# Patient Record
Sex: Female | Born: 1960 | Race: White | Hispanic: No | Marital: Married | Smoking: Never smoker
Health system: Southern US, Community
[De-identification: ages and names within clinical notes are randomized; demographics above are authoritative.]

## PROBLEM LIST (undated history)

## (undated) DIAGNOSIS — Z800 Family history of malignant neoplasm of digestive organs: Secondary | ICD-10-CM

## (undated) DIAGNOSIS — D649 Anemia, unspecified: Secondary | ICD-10-CM

## (undated) DIAGNOSIS — Z803 Family history of malignant neoplasm of breast: Secondary | ICD-10-CM

## (undated) DIAGNOSIS — Z8041 Family history of malignant neoplasm of ovary: Secondary | ICD-10-CM

## (undated) DIAGNOSIS — Z8249 Family history of ischemic heart disease and other diseases of the circulatory system: Secondary | ICD-10-CM

## (undated) HISTORY — PX: BREAST BIOPSY: SHX20

## (undated) HISTORY — DX: Family history of malignant neoplasm of digestive organs: Z80.0

## (undated) HISTORY — PX: TONSILLECTOMY AND ADENOIDECTOMY: SUR1326

## (undated) HISTORY — DX: Anemia, unspecified: D64.9

## (undated) HISTORY — DX: Family history of malignant neoplasm of ovary: Z80.41

## (undated) HISTORY — DX: Family history of ischemic heart disease and other diseases of the circulatory system: Z82.49

## (undated) HISTORY — PX: TUBAL LIGATION: SHX77

## (undated) HISTORY — PX: ENDOMETRIAL ABLATION: SHX621

## (undated) HISTORY — DX: Family history of malignant neoplasm of breast: Z80.3

---

## 2005-09-23 ENCOUNTER — Other Ambulatory Visit: Admission: RE | Admit: 2005-09-23 | Discharge: 2005-09-23 | Payer: Self-pay | Admitting: Obstetrics & Gynecology

## 2005-10-07 ENCOUNTER — Encounter: Admission: RE | Admit: 2005-10-07 | Discharge: 2005-10-07 | Payer: Self-pay | Admitting: Obstetrics & Gynecology

## 2006-11-02 ENCOUNTER — Other Ambulatory Visit: Admission: RE | Admit: 2006-11-02 | Discharge: 2006-11-02 | Payer: Self-pay | Admitting: Obstetrics & Gynecology

## 2006-11-03 ENCOUNTER — Encounter: Admission: RE | Admit: 2006-11-03 | Discharge: 2006-11-03 | Payer: Self-pay | Admitting: Obstetrics & Gynecology

## 2007-12-13 ENCOUNTER — Other Ambulatory Visit: Admission: RE | Admit: 2007-12-13 | Discharge: 2007-12-13 | Payer: Self-pay | Admitting: Obstetrics & Gynecology

## 2007-12-25 ENCOUNTER — Ambulatory Visit (HOSPITAL_BASED_OUTPATIENT_CLINIC_OR_DEPARTMENT_OTHER): Admission: RE | Admit: 2007-12-25 | Discharge: 2007-12-25 | Payer: Self-pay | Admitting: Obstetrics & Gynecology

## 2008-12-26 ENCOUNTER — Ambulatory Visit: Payer: Self-pay | Admitting: Diagnostic Radiology

## 2008-12-26 ENCOUNTER — Ambulatory Visit (HOSPITAL_BASED_OUTPATIENT_CLINIC_OR_DEPARTMENT_OTHER): Admission: RE | Admit: 2008-12-26 | Discharge: 2008-12-26 | Payer: Self-pay | Admitting: Obstetrics & Gynecology

## 2010-01-14 ENCOUNTER — Ambulatory Visit (HOSPITAL_BASED_OUTPATIENT_CLINIC_OR_DEPARTMENT_OTHER): Admission: RE | Admit: 2010-01-14 | Discharge: 2010-01-14 | Payer: Self-pay | Admitting: Obstetrics & Gynecology

## 2010-01-14 ENCOUNTER — Ambulatory Visit: Payer: Self-pay | Admitting: Diagnostic Radiology

## 2010-07-26 ENCOUNTER — Encounter: Payer: Self-pay | Admitting: Obstetrics & Gynecology

## 2011-01-28 ENCOUNTER — Other Ambulatory Visit: Payer: Self-pay | Admitting: Obstetrics & Gynecology

## 2011-01-28 DIAGNOSIS — Z1231 Encounter for screening mammogram for malignant neoplasm of breast: Secondary | ICD-10-CM

## 2011-02-10 ENCOUNTER — Ambulatory Visit (HOSPITAL_BASED_OUTPATIENT_CLINIC_OR_DEPARTMENT_OTHER)
Admission: RE | Admit: 2011-02-10 | Discharge: 2011-02-10 | Disposition: A | Discharge status: Home / Self Care | Payer: 59 | Source: Ambulatory Visit | Attending: Obstetrics & Gynecology | Admitting: Obstetrics & Gynecology

## 2011-02-10 DIAGNOSIS — Z1231 Encounter for screening mammogram for malignant neoplasm of breast: Secondary | ICD-10-CM | POA: Insufficient documentation

## 2012-02-24 ENCOUNTER — Other Ambulatory Visit: Payer: Self-pay | Admitting: Obstetrics & Gynecology

## 2012-02-24 DIAGNOSIS — Z1231 Encounter for screening mammogram for malignant neoplasm of breast: Secondary | ICD-10-CM

## 2012-02-25 ENCOUNTER — Ambulatory Visit (HOSPITAL_BASED_OUTPATIENT_CLINIC_OR_DEPARTMENT_OTHER)
Admission: RE | Admit: 2012-02-25 | Discharge: 2012-02-25 | Disposition: A | Discharge status: Home / Self Care | Payer: 59 | Source: Ambulatory Visit | Attending: Obstetrics & Gynecology | Admitting: Obstetrics & Gynecology

## 2012-02-25 DIAGNOSIS — Z1231 Encounter for screening mammogram for malignant neoplasm of breast: Secondary | ICD-10-CM | POA: Insufficient documentation

## 2013-02-16 ENCOUNTER — Other Ambulatory Visit: Payer: Self-pay | Admitting: Obstetrics & Gynecology

## 2013-02-16 DIAGNOSIS — Z1231 Encounter for screening mammogram for malignant neoplasm of breast: Secondary | ICD-10-CM

## 2013-02-26 ENCOUNTER — Inpatient Hospital Stay (HOSPITAL_BASED_OUTPATIENT_CLINIC_OR_DEPARTMENT_OTHER): Admission: RE | Admit: 2013-02-26 | Payer: 59 | Source: Ambulatory Visit

## 2013-03-07 ENCOUNTER — Ambulatory Visit (HOSPITAL_BASED_OUTPATIENT_CLINIC_OR_DEPARTMENT_OTHER)
Admission: RE | Admit: 2013-03-07 | Discharge: 2013-03-07 | Disposition: A | Discharge status: Home / Self Care | Payer: 59 | Source: Ambulatory Visit | Attending: Obstetrics & Gynecology | Admitting: Obstetrics & Gynecology

## 2013-03-07 DIAGNOSIS — Z1231 Encounter for screening mammogram for malignant neoplasm of breast: Secondary | ICD-10-CM

## 2013-03-30 ENCOUNTER — Encounter: Payer: Self-pay | Admitting: Obstetrics & Gynecology

## 2013-08-23 ENCOUNTER — Encounter: Payer: Self-pay | Admitting: Obstetrics & Gynecology

## 2013-08-24 ENCOUNTER — Ambulatory Visit (INDEPENDENT_AMBULATORY_CARE_PROVIDER_SITE_OTHER): Payer: 59 | Admitting: Obstetrics & Gynecology

## 2013-08-24 ENCOUNTER — Encounter: Payer: Self-pay | Admitting: Obstetrics & Gynecology

## 2013-08-24 VITALS — BP 122/78 | HR 64 | Resp 16 | Ht 61.25 in | Wt 196.8 lb

## 2013-08-24 DIAGNOSIS — Z01419 Encounter for gynecological examination (general) (routine) without abnormal findings: Secondary | ICD-10-CM

## 2013-08-24 DIAGNOSIS — Z0000 Encounter for general adult medical examination without abnormal findings: Secondary | ICD-10-CM

## 2013-08-24 LAB — POCT URINALYSIS DIPSTICK
Bilirubin, UA: NEGATIVE
Glucose, UA: NEGATIVE
KETONES UA: NEGATIVE
NITRITE UA: NEGATIVE
PH UA: 6
PROTEIN UA: NEGATIVE
UROBILINOGEN UA: NEGATIVE

## 2013-08-24 LAB — HEMOGLOBIN, FINGERSTICK: HEMOGLOBIN, FINGERSTICK: 12.7 g/dL (ref 12.0–16.0)

## 2013-08-24 NOTE — Patient Instructions (Signed)

## 2013-08-24 NOTE — Progress Notes (Signed)
53 y.o. G3P3 MarriedCaucasianF here for annual exam.  Cycles are becoming less frequently.  Hasn't skipped 90 days yet.  When she does have a cycle, it is heavy and lasts 6-7 for 2-3 of these heavy.  A little heavier than her "normal" cycles.  She knows if she doesn't have a cycle for 90 days, she needs to call for Rx for provera.    Son is getting married in the summer.  Pt working on weight loss.  Patient's last menstrual period was 06/29/2013.          Sexually active: yes  The current method of family planning is tubal ligation.    Exercising: yes  walking dog Smoker:  no  Health Maintenance: Pap:  05/18/12 WNL/negative HR HPV History of abnormal Pap:  no MMG:  03/07/13 normal Colonoscopy:  none BMD:   none TDaP:  7/10 Screening Labs: 11/13, Hb today: 12.7, Urine today: WBC-trace, RBC-trace, PH-6.0   reports that she has never smoked. She has never used smokeless tobacco. She reports that she does not drink alcohol or use illicit drugs.  Past Medical History  Diagnosis Date  . Anemia   . Family history of breast cancer     took tamoxifen prophylactically    Past Surgical History  Procedure Laterality Date  . Endometrial ablation      hysteroscopy  . Tubal ligation    . Tonsillectomy and adenoidectomy      Current Outpatient Prescriptions  Medication Sig Dispense Refill  . Calcium-Vitamin D-Vitamin K (CALCIUM SOFT CHEWS PO) Take by mouth daily.      . IRON PO Take 65 mg by mouth daily.        No current facility-administered medications for this visit.    Family History  Problem Relation Age of Onset  . Breast cancer Sister [id number]  . Heart attack Father   . Heart attack Mother   . Heart attack Brother   . Dementia Mother     ROS:  Pertinent items are noted in HPI.  Otherwise, a comprehensive ROS was negative.  Exam:   BP 122/78  Pulse 64  Resp 16  Ht 5' 1.25" (1.556 m)  Wt 196 lb 12.8 oz (89.268 kg)  BMI 36.87 kg/m2  LMP 06/29/2013  Weight change:-15lb    Height: 5' 1.25" (155.6 cm)  Ht Readings from Last 3 Encounters:  08/24/13 5' 1.25" (1.556 m)    General appearance: alert, cooperative and appears stated age Head: Normocephalic, without obvious abnormality, atraumatic Neck: no adenopathy, supple, symmetrical, trachea midline and thyroid normal to inspection and palpation Lungs: clear to auscultation bilaterally Breasts: normal appearance, no masses or tenderness Heart: regular rate and rhythm Abdomen: soft, non-tender; bowel sounds normal; no masses,  no organomegaly Extremities: extremities normal, atraumatic, no cyanosis or edema Skin: Skin color, texture, turgor normal. No rashes or lesions Lymph nodes: Cervical, supraclavicular, and axillary nodes normal. No abnormal inguinal nodes palpated Neurologic: Grossly normal   Pelvic: External genitalia:  no lesions              Urethra:  normal appearing urethra with no masses, tenderness or lesions              Bartholins and Skenes: normal                 Vagina: normal appearing vagina with normal color and discharge, no lesions              Cervix: no lesions  Pap taken: no Bimanual Exam:  Uterus:  normal size, contour, position, consistency, mobility, non-tender              Adnexa: normal adnexa and no mass, fullness, tenderness               Rectovaginal: Confirms               Anus:  normal sphincter tone, no lesions  A:  Well Woman with normal exam Perimenopausal H/O menorrhagia with u/s and endometrial biopsy 9/11 Strong family hx of breast cancer.  Took Tamoxifen prophylacticly. Family hx of cardiovascular disease  P:   Mammogram yearly.  D/W pt 3D screening due to family hx. pap smear with neg HR HPV testing 11/13. Labs last year.  Will schedule colonoscopy with PCP. D/w pt referral to cardiology for establishing care.  Baby aspirin discussed. return annually or prn  An After Visit Summary was printed and given to the patient.

## 2014-03-08 ENCOUNTER — Other Ambulatory Visit: Payer: Self-pay | Admitting: Obstetrics & Gynecology

## 2014-03-08 DIAGNOSIS — Z1231 Encounter for screening mammogram for malignant neoplasm of breast: Secondary | ICD-10-CM

## 2014-03-18 ENCOUNTER — Ambulatory Visit (HOSPITAL_BASED_OUTPATIENT_CLINIC_OR_DEPARTMENT_OTHER)
Admission: RE | Admit: 2014-03-18 | Discharge: 2014-03-18 | Disposition: A | Discharge status: Home / Self Care | Payer: 59 | Source: Ambulatory Visit | Attending: Obstetrics & Gynecology | Admitting: Obstetrics & Gynecology

## 2014-03-18 DIAGNOSIS — Z1231 Encounter for screening mammogram for malignant neoplasm of breast: Secondary | ICD-10-CM | POA: Diagnosis not present

## 2014-05-06 ENCOUNTER — Encounter: Payer: Self-pay | Admitting: Obstetrics & Gynecology

## 2014-09-05 ENCOUNTER — Ambulatory Visit (INDEPENDENT_AMBULATORY_CARE_PROVIDER_SITE_OTHER): Payer: 59 | Admitting: Obstetrics & Gynecology

## 2014-09-05 ENCOUNTER — Encounter: Payer: Self-pay | Admitting: Obstetrics & Gynecology

## 2014-09-05 VITALS — BP 124/84 | HR 60 | Resp 16 | Ht 61.5 in | Wt 214.2 lb

## 2014-09-05 DIAGNOSIS — R312 Other microscopic hematuria: Secondary | ICD-10-CM

## 2014-09-05 DIAGNOSIS — Z8249 Family history of ischemic heart disease and other diseases of the circulatory system: Secondary | ICD-10-CM | POA: Diagnosis not present

## 2014-09-05 DIAGNOSIS — Z01419 Encounter for gynecological examination (general) (routine) without abnormal findings: Secondary | ICD-10-CM

## 2014-09-05 DIAGNOSIS — Z124 Encounter for screening for malignant neoplasm of cervix: Secondary | ICD-10-CM

## 2014-09-05 DIAGNOSIS — R3129 Other microscopic hematuria: Secondary | ICD-10-CM

## 2014-09-05 DIAGNOSIS — Z0000 Encounter for general adult medical examination without abnormal findings: Secondary | ICD-10-CM

## 2014-09-05 LAB — POCT URINALYSIS DIPSTICK
Bilirubin, UA: NEGATIVE
Glucose, UA: NEGATIVE
Ketones, UA: NEGATIVE
LEUKOCYTES UA: NEGATIVE
NITRITE UA: NEGATIVE
PH UA: 5
PROTEIN UA: NEGATIVE
UROBILINOGEN UA: NEGATIVE

## 2014-09-05 LAB — URINALYSIS, MICROSCOPIC ONLY
Bacteria, UA: NONE SEEN
CASTS: NONE SEEN
Crystals: NONE SEEN
SQUAMOUS EPITHELIAL / LPF: NONE SEEN

## 2014-09-05 NOTE — Progress Notes (Signed)
54 y.o. G3P3 MarriedCaucasianF here for annual exam.  Reports cycles are becoming less frequent.  First six moths of the year were every three months but in September they became more regular.  Cycle in January was very heavy for three days.  This was much heavier than and lasted longer then normal for her.  Didn't have a February cycle and hasn't cycled this month already either.  Pt has a strong family hx of cardiovascular disease.  Two of three brothers have had MI's.  Mother with hx of MI.  We discussed last year establishing care with cardiologist now.  She spoke with PCP about this and he felt that was unnecessary.  Denies CP and SOB.  PCP:  Dr. Leonette MostKalish.  Did blood work last summer with him.    Patient's last menstrual period was 07/06/2014.          Sexually active: Yes.    The current method of family planning is tubal ligation.    Exercising: Yes.    walking daily-30 minutes Smoker:  no  Health Maintenance: Pap:  05/18/12 WNL/negative HR HPV History of abnormal Pap:  no MMG:  03/18/14-normal Colonoscopy:  7/15-repeat in 3 years, five polyps (Dr. Conley RollsLe) BMD:   none TDaP:  7/10 Screening Labs: pcp, Hb today: donate blood, Urine today: RBC-1+   reports that she has never smoked. She has never used smokeless tobacco. She reports that she does not drink alcohol or use illicit drugs.  Past Medical History  Diagnosis Date  . Anemia   . Family history of breast cancer     took tamoxifen prophylactically    Past Surgical History  Procedure Laterality Date  . Endometrial ablation      hysteroscopy  . Tubal ligation    . Tonsillectomy and adenoidectomy      Current Outpatient Prescriptions  Medication Sig Dispense Refill  . Calcium-Vitamin D-Vitamin K (CALCIUM SOFT CHEWS PO) Take by mouth daily.    . IRON PO Take 65 mg by mouth daily.     . Multiple Vitamins-Calcium (ONE-A-DAY WOMENS PO) Take by mouth.    Marland Kitchen. PATADAY 0.2 % SOLN   11   No current facility-administered medications  for this visit.    Family History  Problem Relation Age of Onset  . Breast cancer Sister 5639  . Heart attack Father   . Heart attack Mother   . Heart attack Brother   . Dementia Mother     ROS:  Pertinent items are noted in HPI.  Otherwise, a comprehensive ROS was negative.  Exam:   General appearance: alert, cooperative and appears stated age Head: Normocephalic, without obvious abnormality, atraumatic Neck: no adenopathy, supple, symmetrical, trachea midline and thyroid normal to inspection and palpation Lungs: clear to auscultation bilaterally Breasts: normal appearance, no masses or tenderness Heart: regular rate and rhythm Abdomen: soft, non-tender; bowel sounds normal; no masses,  no organomegaly Extremities: extremities normal, atraumatic, no cyanosis or edema Skin: Skin color, texture, turgor normal. No rashes or lesions Lymph nodes: Cervical, supraclavicular, and axillary nodes normal. No abnormal inguinal nodes palpated Neurologic: Grossly normal   Pelvic: External genitalia:  no lesions              Urethra:  normal appearing urethra with no masses, tenderness or lesions              Bartholins and Skenes: normal                 Vagina: normal appearing  vagina with normal color and discharge, no lesions              Cervix: no lesions              Pap taken: Yes.   Bimanual Exam:  Uterus:  normal size, contour, position, consistency, mobility, non-tender              Adnexa: normal adnexa and no mass, fullness, tenderness               Rectovaginal: Confirms               Anus:  normal sphincter tone, no lesions  Chaperone was present for exam.  A:  Well Woman with normal exam Perimenopausal H/O menorrhagia with u/s and endometrial biopsy 9/11 Strong family hx of breast cancer. Took Tamoxifen prophylacticly. Family hx of cardiovascular disease Microscopic hematuria  P: Mammogram yearly. D/W pt 3D screening due to family hx. pap smear with neg HR  HPV testing 11/13.  Pap done today. Labs with PCP. Cardiology referral will be made.   Urine today. Release of colonoscopy report return annually or prn

## 2014-09-06 LAB — IPS PAP TEST WITH REFLEX TO HPV

## 2014-10-10 NOTE — Progress Notes (Addendum)
Patient ID: Angel LeveringSandra Burch, female   DOB: Aug 17, 1960, 54 y.o.   MRN: [id number]     Cardiology Office Note   Date:  10/10/2014   ID:  Angel LeveringSandra Burch, DOB Aug 17, 1960, MRN [id number]  PCP:  Sid FalconKALISH, MICHAEL J, MD  Cardiologist:   Charlton HawsPeter Jaeden Messer, MD   No chief complaint on file.     History of Present Illness: Angel Burch is a 54 y.o. female who presents for evaluation of CAD risk  Pt has a strong family hx of cardiovascular disease. Two of three brothers have had MI's. Mother with hx of MI. Reviewed her family tree graphic which is quite impressive especially on fathers side and two brothers. She has no symptoms or chest pain  ECG ok  Cholesterol only 101 not at level that would need Rx by guidelines.  Non smoker  Discussed screening tests and felt calcium score best test for her   Labs from primary reviewed normal BMET, CBC and TSH  TC 182  LDL 101    She had coronary calcium score today which I reviewed  Score 169 97th percentile for age and sex matched controls Also noted liver steatosis  She drinks ETOH rarely  Discussed initiation of statin and asa with f/u ETT given high relative score for age   Past Medical History  Diagnosis Date  . Anemia   . Family history of breast cancer     took tamoxifen prophylactically  . Family history of heart disease     Past Surgical History  Procedure Laterality Date  . Endometrial ablation      hysteroscopy  . Tubal ligation    . Tonsillectomy and adenoidectomy       Current Outpatient Prescriptions  Medication Sig Dispense Refill  . Calcium-Vitamin D-Vitamin K (CALCIUM SOFT CHEWS PO) Take by mouth daily.    . IRON PO Take 65 mg by mouth daily.     . Multiple Vitamins-Calcium (ONE-A-DAY WOMENS PO) Take by mouth.    Marland Kitchen. PATADAY 0.2 % SOLN   11   No current facility-administered medications for this visit.    Allergies:   Tape    Social History:  The patient  reports that she has never smoked. She has never used smokeless tobacco. She  reports that she does not drink alcohol or use illicit drugs.   Family History:  The patient's family history includes Breast cancer (age of onset: 8839) in her sister; Cirrhosis in her paternal grandfather; Dementia in her mother; Emphysema in her maternal grandfather; Heart attack in her brother, brother, father, and mother; Heart disease in her paternal grandmother; Pulmonary embolism in her paternal aunt.    ROS:  Please see the history of present illness.   Otherwise, review of systems are positive for none.   All other systems are reviewed and negative.    PHYSICAL EXAM: VS:  There were no vitals taken for this visit. , BMI There is no weight on file to calculate BMI. GEN: Well nourished, well developed, in no acute distress HEENT: normal Neck: no JVD, carotid bruits, or masses Cardiac:  RRR; no murmurs, rubs, or gallops,no edema  Respiratory:  clear to auscultation bilaterally, normal work of breathing GI: soft, nontender, nondistended, + BS MS: no deformity or atrophy Skin: warm and dry, no rash Neuro:  Strength and sensation are intact Psych: euthymic mood, full affect   EKG:   SR rate 58  Low voltage     Recent Labs: No results found for requested labs  within last 365 days.    Lipid Panel No results found for: CHOL, TRIG, HDL, CHOLHDL, VLDL, LDLCALC, LDLDIRECT    Wt Readings from Last 3 Encounters:  09/05/14 214 lb 3.2 oz (97.16 kg)  08/24/13 196 lb 12.8 oz (89.268 kg)      Other studies Reviewed: Additional studies/ records that were reviewed today include: notes from OB/GYN.    ASSESSMENT AND PLAN:  1.  CAD :  Bad family history with high CACS score  Start statin and asa f/u ETT 2. Liver steatosis: not a drinker she is middle aged and overweight f/u with primary discussed with her    Current medicines are reviewed at length with the patient today.  The patient does not have concerns regarding medicines.  The following changes have been made:  no  change  Labs/ tests ordered today include:  Calcium Score  No orders of the defined types were placed in this encounter.     Disposition:   FU with  In a year     i    Signed, Charlton Haws, MD  10/10/2014 9:23 AM    [facility] [address] [place], [place], [place]  [address] Phone: [phone]; Fax: [phone]

## 2014-10-11 ENCOUNTER — Ambulatory Visit: Payer: Self-pay | Admitting: Cardiovascular Disease

## 2014-10-11 ENCOUNTER — Encounter: Payer: Self-pay | Admitting: Cardiovascular Disease

## 2014-10-11 ENCOUNTER — Ambulatory Visit (INDEPENDENT_AMBULATORY_CARE_PROVIDER_SITE_OTHER)
Admission: RE | Admit: 2014-10-11 | Discharge: 2014-10-11 | Disposition: A | Discharge status: Home / Self Care | Payer: 59 | Source: Ambulatory Visit | Attending: Cardiovascular Disease | Admitting: Cardiovascular Disease

## 2014-10-11 ENCOUNTER — Ambulatory Visit (INDEPENDENT_AMBULATORY_CARE_PROVIDER_SITE_OTHER): Payer: 59 | Admitting: Cardiovascular Disease

## 2014-10-11 ENCOUNTER — Telehealth: Payer: Self-pay | Admitting: *Deleted

## 2014-10-11 VITALS — BP 120/78 | HR 58 | Ht 62.0 in | Wt 213.8 lb

## 2014-10-11 DIAGNOSIS — Z8249 Family history of ischemic heart disease and other diseases of the circulatory system: Secondary | ICD-10-CM | POA: Diagnosis not present

## 2014-10-11 DIAGNOSIS — R931 Abnormal findings on diagnostic imaging of heart and coronary circulation: Secondary | ICD-10-CM

## 2014-10-11 MED ORDER — ATORVASTATIN CALCIUM 10 MG PO TABS: 10.0000 mg | ORAL_TABLET | Freq: Every day | ORAL | Status: DC

## 2014-10-11 NOTE — Telephone Encounter (Signed)
PT  AWARE  NEEDS  GXT   AND  TO START  ATORVASTATIN   10 MG   NEEDS LIPID AND LIVER IN 3 MONTHS  AS  WELL AS  F/U APPT  WITH DR Eden EmmsNISHAN .ORDERS  ENTERED  .Zack Seal/CY

## 2014-10-11 NOTE — Addendum Note (Signed)
Addended by: Wendall StadeNISHAN, Derreck Wiltsey C on: 10/11/2014 12:16 PM   Modules accepted: Level of Service

## 2014-10-11 NOTE — Patient Instructions (Addendum)
Your physician recommends that you schedule a follow-up appointment in: [facility]  Your physician recommends that you continue on your current medications as directed. Please refer to the Current Medication list given to you today.  CALCIUM SCORE OUT OF POCKET  $150.00

## 2014-11-18 ENCOUNTER — Encounter: Payer: 59 | Admitting: Physician Assistant

## 2014-11-18 ENCOUNTER — Ambulatory Visit (INDEPENDENT_AMBULATORY_CARE_PROVIDER_SITE_OTHER): Payer: 59

## 2014-11-18 ENCOUNTER — Telehealth: Payer: Self-pay

## 2014-11-18 DIAGNOSIS — I2510 Atherosclerotic heart disease of native coronary artery without angina pectoris: Secondary | ICD-10-CM | POA: Diagnosis not present

## 2014-11-18 DIAGNOSIS — R931 Abnormal findings on diagnostic imaging of heart and coronary circulation: Secondary | ICD-10-CM

## 2014-11-18 LAB — EXERCISE TOLERANCE TEST
CHL CUP MPHR: 166 {beats}/min
CHL CUP STRESS STAGE 1 GRADE: 0 %
CHL CUP STRESS STAGE 1 HR: 116 {beats}/min
CHL CUP STRESS STAGE 1 SPEED: 0 mph
CHL CUP STRESS STAGE 2 GRADE: 0 %
CHL CUP STRESS STAGE 2 HR: 104 {beats}/min
CHL CUP STRESS STAGE 3 HR: 104 {beats}/min
CHL CUP STRESS STAGE 4 DBP: 81 mmHg
CHL CUP STRESS STAGE 4 SPEED: 1.7 mph
CHL CUP STRESS STAGE 5 SPEED: 2.5 mph
CHL CUP STRESS STAGE 6 GRADE: 14 %
CHL CUP STRESS STAGE 6 HR: 148 {beats}/min
CHL CUP STRESS STAGE 7 DBP: 88 mmHg
CHL CUP STRESS STAGE 7 GRADE: 0 %
CHL CUP STRESS STAGE 8 GRADE: 0 %
CSEPEW: 8.5 METS
CSEPHR: 89 %
CSEPPHR: 148 {beats}/min
CSEPPMHR: 89 %
Exercise duration (min): 7 min
RPE: 17
Rest HR: 74 {beats}/min
Stage 1 DBP: 62 mmHg
Stage 1 SBP: 126 mmHg
Stage 2 Speed: 1 mph
Stage 3 Grade: 0.1 %
Stage 3 Speed: 1 mph
Stage 4 Grade: 10 %
Stage 4 HR: 121 {beats}/min
Stage 4 SBP: 174 mmHg
Stage 5 DBP: 83 mmHg
Stage 5 Grade: 12 %
Stage 5 HR: 136 {beats}/min
Stage 5 SBP: 193 mmHg
Stage 6 Speed: 3.4 mph
Stage 7 HR: 129 {beats}/min
Stage 7 SBP: 185 mmHg
Stage 7 Speed: 1.5 mph
Stage 8 DBP: 70 mmHg
Stage 8 HR: 100 {beats}/min
Stage 8 SBP: 120 mmHg
Stage 8 Speed: 0 mph

## 2014-11-18 NOTE — Telephone Encounter (Signed)
-----   Message from Jerene BearsMary S Miller, MD sent at 10/29/2014 10:58 PM EDT ----- Micro was negative.  Nothing else needed.  If you have any concerns, can we discuss when I return to office?  MSM ----- Message -----    From: Elisha HeadlandKelly S Eleftheria Taborn, CMA    Sent: 10/29/2014   9:24 AM      To: Jerene BearsMary S Miller, MD  Dr Hyacinth MeekerMiller, will you make sure nothing is needed for the urine micro this patient had done on 09/05/14//kn

## 2014-11-19 ENCOUNTER — Telehealth: Payer: Self-pay | Admitting: Cardiovascular Disease

## 2014-11-19 NOTE — Telephone Encounter (Signed)
Called patient back with stress test results. Patient verbalized understanding. Patient informed the office that she is suppose to see Dr. Eden EmmsNishan in 3 months. Transferred patient to scheduling to help her with making an appointment.

## 2014-11-19 NOTE — Telephone Encounter (Signed)
New message      Returning Christine's call from yesterday

## 2015-01-18 ENCOUNTER — Other Ambulatory Visit: Payer: Self-pay | Admitting: Cardiovascular Disease

## 2015-01-20 ENCOUNTER — Other Ambulatory Visit: Payer: Self-pay

## 2015-01-20 MED ORDER — ATORVASTATIN CALCIUM 10 MG PO TABS: 10.0000 mg | ORAL_TABLET | Freq: Every day | ORAL | Status: DC

## 2015-02-03 NOTE — Progress Notes (Signed)
Patient ID: Angel Burch, female   DOB: 1960/09/25, 54 y.o.   MRN: [id number]     Cardiology Office Note   Date:  02/07/2015   ID:  Angel Burch, DOB Jun 20, 1961, MRN [id number]  PCP:  Sid Falcon, MD  Cardiologist:   Charlton Haws, MD   Chief Complaint  Patient presents with  . Follow-up    family history of earlier CAD      History of Present Illness: Angel Burch is a 54 y.o. female who presents for evaluation of CAD risk  Pt has a strong family hx of cardiovascular disease. Two of three brothers have had MI's. Mother with hx of MI. Reviewed her family tree graphic which is quite impressive especially on fathers side and two brothers. She has no symptoms or chest pain  ECG ok  Cholesterol only 101 not at level that would need Rx by guidelines.  Non smoker  Discussed screening tests and felt calcium score best test for her   Labs from primary reviewed normal BMET, CBC and TSH  TC 182  LDL 101  Now on statin last LDL 02/2015 down to 55   She had coronary calcium score April  which I reviewed  Score 169 97th percentile for age and sex matched controls Also noted liver steatosis  She drinks ETOH rarely  Started on statin last visit with ASA  Some right knee pain that may be more arthritic than statin related  ETT done 11/18/14 and was normal    No cardiac complaints  Sees Dr Callie Fielding at [facility].  Recent poison ivy and some chronic left cervical neck pain   Most recent office notes and labs from primary reviewed   Past Medical History  Diagnosis Date  . Anemia   . Family history of breast cancer     took tamoxifen prophylactically  . Family history of heart disease     Past Surgical History  Procedure Laterality Date  . Endometrial ablation      hysteroscopy  . Tubal ligation    . Tonsillectomy and adenoidectomy       Current Outpatient Prescriptions  Medication Sig Dispense Refill  . aspirin 81 MG tablet Take 81 mg by mouth daily.    Marland Kitchen atorvastatin  (LIPITOR) 10 MG tablet Take 1 tablet (10 mg total) by mouth daily. 30 tablet 3  . Calcium-Vitamin D-Vitamin K (CALCIUM SOFT CHEWS PO) Take 1 tablet by mouth daily.     . IRON PO Take 45 mg by mouth daily.     . Multiple Vitamin (MULTIVITAMIN) tablet Take 1 tablet by mouth daily.    Marland Kitchen PATADAY 0.2 % SOLN Place 1 drop into both eyes daily.   11   No current facility-administered medications for this visit.    Allergies:   Tape    Social History:  The patient  reports that she has never smoked. She has never used smokeless tobacco. She reports that she does not drink alcohol or use illicit drugs.   Family History:  The patient's family history includes Breast cancer (age of onset: 103) in her sister; Cirrhosis in her paternal grandfather; Dementia in her mother; Emphysema in her maternal grandfather; Heart attack in her brother, brother, father, and mother; Heart disease in her paternal grandmother; Pulmonary embolism in her paternal aunt.    ROS:  Please see the history of present illness.   Otherwise, review of systems are positive for none.   All other systems are reviewed and negative.  PHYSICAL EXAM: VS:  BP 124/72 mmHg  Pulse 72  Ht  (1.575 m)  Wt 98.34 kg (216 lb 12.8 oz)  BMI 39.64 kg/m2  SpO2 98% , BMI Body mass index is 39.64 kg/(m^2). GEN: Well nourished, well developed, in no acute distress HEENT: normal Neck: no JVD, carotid bruits, or masses Cardiac:  RRR; no murmurs, rubs, or gallops,no edema  Respiratory:  clear to auscultation bilaterally, normal work of breathing GI: soft, nontender, nondistended, + BS MS: no deformity or atrophy Skin: warm and dry, no rash Neuro:  Strength and sensation are intact Psych: euthymic mood, full affect   EKG:   SR rate 58  Low voltage     Recent Labs: 02/04/2015: ALT 20    Lipid Panel    Component Value Date/Time   CHOL 136 02/04/2015 0906   TRIG 92.0 02/04/2015 0906   HDL 62.90 02/04/2015 0906   CHOLHDL 2 02/04/2015  0906   VLDL 18.4 02/04/2015 0906   LDLCALC 55 02/04/2015 0906      Wt Readings from Last 3 Encounters:  02/07/15 98.34 kg (216 lb 12.8 oz)  10/11/14 96.979 kg (213 lb 12.8 oz)  09/05/14 97.16 kg (214 lb 3.2 oz)      Other studies Reviewed: Additional studies/ records that were reviewed today include: notes from OB/GYN. / [facility]     ASSESSMENT AND PLAN:  1.  CAD :  Bad family history with high CACS score   Tolerating statin / ASA  LDL very low now FU Calcium score 3 years 2. Liver steatosis: not a drinker she is middle aged and overweight f/u with primary discussed with her  3. Poison ivy LE;s resolved  4. Right knee pain.  Normal exam doubt related to statin given asymmetry follow    Current medicines are reviewed at length with the patient today.  The patient does not have concerns regarding medicines.  The following changes have been made:  no change  Labs/ tests ordered today include:  Calcium Score  No orders of the defined types were placed in this encounter.     Disposition:   FU with  In a year     i    Signed, Charlton Haws, MD  02/07/2015 8:06 AM    [facility] [address] [place], [place], [place]  [address] Phone: [phone]; Fax: [phone]

## 2015-02-04 ENCOUNTER — Other Ambulatory Visit (INDEPENDENT_AMBULATORY_CARE_PROVIDER_SITE_OTHER): Payer: 59 | Admitting: *Deleted

## 2015-02-04 DIAGNOSIS — I2510 Atherosclerotic heart disease of native coronary artery without angina pectoris: Secondary | ICD-10-CM

## 2015-02-04 DIAGNOSIS — R931 Abnormal findings on diagnostic imaging of heart and coronary circulation: Secondary | ICD-10-CM

## 2015-02-04 LAB — LIPID PANEL
Cholesterol: 136 mg/dL (ref 0–200)
HDL: 62.9 mg/dL (ref >39.00)
LDL Cholesterol: 55 mg/dL (ref 0–99)
NonHDL: 73.03
Total CHOL/HDL Ratio: 2
Triglycerides: 92 mg/dL (ref 0.0–149.0)
VLDL: 18.4 mg/dL (ref 0.0–40.0)

## 2015-02-04 LAB — HEPATIC FUNCTION PANEL
ALBUMIN: 4.4 g/dL (ref 3.5–5.2)
ALK PHOS: 78 U/L (ref 39–117)
ALT: 20 U/L (ref 0–35)
AST: 17 U/L (ref 0–37)
BILIRUBIN TOTAL: 0.6 mg/dL (ref 0.2–1.2)
Bilirubin, Direct: 0.1 mg/dL (ref 0.0–0.3)
TOTAL PROTEIN: 7.7 g/dL (ref 6.0–8.3)

## 2015-02-07 ENCOUNTER — Other Ambulatory Visit: Payer: Self-pay | Admitting: *Deleted

## 2015-02-07 ENCOUNTER — Encounter: Payer: Self-pay | Admitting: Cardiovascular Disease

## 2015-02-07 ENCOUNTER — Ambulatory Visit (INDEPENDENT_AMBULATORY_CARE_PROVIDER_SITE_OTHER): Payer: 59 | Admitting: Cardiovascular Disease

## 2015-02-07 VITALS — BP 124/72 | HR 72 | Ht 62.0 in | Wt 216.8 lb

## 2015-02-07 DIAGNOSIS — I2510 Atherosclerotic heart disease of native coronary artery without angina pectoris: Secondary | ICD-10-CM

## 2015-02-07 DIAGNOSIS — I2583 Coronary atherosclerosis due to lipid rich plaque: Principal | ICD-10-CM

## 2015-02-07 MED ORDER — ATORVASTATIN CALCIUM 10 MG PO TABS: 10.0000 mg | ORAL_TABLET | Freq: Every day | ORAL | Status: DC

## 2015-02-07 NOTE — Patient Instructions (Signed)
Medication Instructions:  No changes  Labwork: NONE  Testing/Procedures: NONE  Follow-Up: Your physician wants you to follow-up in: YEAR WITH  DR NISHAN  You will receive a reminder letter in the mail two months in advance. If you don't receive a letter, please call our office to schedule the follow-up appointment.  Any Other Special Instructions Will Be Listed Below (If Applicable).   

## 2015-02-18 ENCOUNTER — Other Ambulatory Visit: Payer: Self-pay | Admitting: Obstetrics & Gynecology

## 2015-02-18 DIAGNOSIS — Z1231 Encounter for screening mammogram for malignant neoplasm of breast: Secondary | ICD-10-CM

## 2015-03-24 ENCOUNTER — Ambulatory Visit (HOSPITAL_BASED_OUTPATIENT_CLINIC_OR_DEPARTMENT_OTHER)
Admission: RE | Admit: 2015-03-24 | Discharge: 2015-03-24 | Disposition: A | Discharge status: Home / Self Care | Payer: 59 | Source: Ambulatory Visit | Attending: Obstetrics & Gynecology | Admitting: Obstetrics & Gynecology

## 2015-03-24 DIAGNOSIS — Z1231 Encounter for screening mammogram for malignant neoplasm of breast: Secondary | ICD-10-CM | POA: Diagnosis present

## 2015-03-24 DIAGNOSIS — R928 Other abnormal and inconclusive findings on diagnostic imaging of breast: Secondary | ICD-10-CM | POA: Insufficient documentation

## 2015-03-27 ENCOUNTER — Other Ambulatory Visit: Payer: Self-pay | Admitting: Obstetrics & Gynecology

## 2015-03-27 DIAGNOSIS — R928 Other abnormal and inconclusive findings on diagnostic imaging of breast: Secondary | ICD-10-CM

## 2015-04-01 ENCOUNTER — Ambulatory Visit
Admission: RE | Admit: 2015-04-01 | Discharge: 2015-04-01 | Disposition: A | Discharge status: Home / Self Care | Payer: 59 | Source: Ambulatory Visit | Attending: Obstetrics & Gynecology | Admitting: Obstetrics & Gynecology

## 2015-04-01 ENCOUNTER — Other Ambulatory Visit: Payer: Self-pay | Admitting: Obstetrics & Gynecology

## 2015-04-01 DIAGNOSIS — R928 Other abnormal and inconclusive findings on diagnostic imaging of breast: Secondary | ICD-10-CM

## 2015-04-09 ENCOUNTER — Other Ambulatory Visit: Payer: Self-pay | Admitting: Obstetrics & Gynecology

## 2015-04-09 DIAGNOSIS — R928 Other abnormal and inconclusive findings on diagnostic imaging of breast: Secondary | ICD-10-CM

## 2015-04-11 ENCOUNTER — Ambulatory Visit
Admission: RE | Admit: 2015-04-11 | Discharge: 2015-04-11 | Disposition: A | Discharge status: Home / Self Care | Payer: 59 | Source: Ambulatory Visit | Attending: Obstetrics & Gynecology | Admitting: Obstetrics & Gynecology

## 2015-04-11 DIAGNOSIS — R928 Other abnormal and inconclusive findings on diagnostic imaging of breast: Secondary | ICD-10-CM

## 2015-10-27 DIAGNOSIS — J309 Allergic rhinitis, unspecified: Secondary | ICD-10-CM | POA: Insufficient documentation

## 2015-11-13 ENCOUNTER — Ambulatory Visit (INDEPENDENT_AMBULATORY_CARE_PROVIDER_SITE_OTHER): Payer: 59 | Admitting: Obstetrics & Gynecology

## 2015-11-13 ENCOUNTER — Encounter: Payer: Self-pay | Admitting: Obstetrics & Gynecology

## 2015-11-13 VITALS — BP 140/80 | HR 72 | Resp 16 | Ht 61.0 in | Wt 215.0 lb

## 2015-11-13 DIAGNOSIS — E785 Hyperlipidemia, unspecified: Secondary | ICD-10-CM | POA: Insufficient documentation

## 2015-11-13 DIAGNOSIS — Z01419 Encounter for gynecological examination (general) (routine) without abnormal findings: Secondary | ICD-10-CM | POA: Diagnosis not present

## 2015-11-13 NOTE — Progress Notes (Signed)
55 y.o. G3P3 MarriedCaucasianF here for annual exam.  Pt has not had bleeding in over a year.  She is having hot flashes and some night sweats.  She does not want to be on anything.  Has not used anything for this.    PCP:  Dr. Leonette MostKalish.  Has been tested for Hep C.  Last blood work was done summer 2016.     Patient's last menstrual period was 07/05/2014.          Sexually active: Yes.    The current method of family planning is post menopausal status.    Exercising: Yes.    walking Smoker:  no  Health Maintenance: Pap:  09/05/2014 negative.  Neg HR HPV 2013.   History of abnormal Pap:  no MMG: 04/01/2015 BIRADS 3 probably benign,  Colonoscopy:  01/11/2014.  Follow-up.  Dr. Conley RollsLe.  Had polyps.  BMD:   none TDaP:  7/10 Screening Labs: PCP does labs    reports that she has never smoked. She has never used smokeless tobacco. She reports that she does not drink alcohol or use illicit drugs.  Past Medical History  Diagnosis Date  . Anemia   . Family history of breast cancer     took tamoxifen prophylactically  . Family history of heart disease     Past Surgical History  Procedure Laterality Date  . Endometrial ablation      hysteroscopy  . Tubal ligation    . Tonsillectomy and adenoidectomy      Current Outpatient Prescriptions  Medication Sig Dispense Refill  . aspirin 81 MG tablet Take 81 mg by mouth daily.    Marland Kitchen. atorvastatin (LIPITOR) 10 MG tablet Take 1 tablet (10 mg total) by mouth daily. 90 tablet 3  . Calcium-Vitamin D-Vitamin K (CALCIUM SOFT CHEWS PO) Take 1 tablet by mouth daily.     . IRON PO Take 45 mg by mouth daily.     . Multiple Vitamin (MULTIVITAMIN) tablet Take 1 tablet by mouth daily.    Marland Kitchen. PATADAY 0.2 % SOLN Place 1 drop into both eyes daily.   11   No current facility-administered medications for this visit.    Family History  Problem Relation Age of Onset  . Breast cancer Sister 5439  . Heart attack Father   . Heart attack Mother   . Dementia Mother   .  Heart attack Brother   . Pulmonary embolism Paternal Aunt   . Emphysema Maternal Grandfather   . Heart disease Paternal Grandmother   . Cirrhosis Paternal Grandfather   . Heart attack Brother     ROS:  Pertinent items are noted in HPI.  Otherwise, a comprehensive ROS was negative.  Exam:   BP 140/80 mmHg  Pulse 72  Resp 16  Ht 5\' 1"  (1.549 m)  Wt 215 lb (97.523 kg)  BMI 40.64 kg/m2  LMP 07/05/2014  Weight change: @WEIGHTCHANGE @ Height:   Height: 5\' 1"  (154.9 cm)  Ht Readings from Last 3 Encounters:  11/13/15 5\' 1"  (1.549 m)  02/07/15 5\' 2"  (1.575 m)  10/11/14 5\' 2"  (1.575 m)    General appearance: alert, cooperative and appears stated age Head: Normocephalic, without obvious abnormality, atraumatic Neck: no adenopathy, supple, symmetrical, trachea midline and thyroid normal to inspection and palpation Lungs: clear to auscultation bilaterally Breasts: normal appearance, no masses or tenderness Heart: regular rate and rhythm Abdomen: soft, non-tender; bowel sounds normal; no masses,  no organomegaly Extremities: extremities normal, atraumatic, no cyanosis or edema Skin: Skin  color, texture, turgor normal. No rashes or lesions Lymph nodes: Cervical, supraclavicular, and axillary nodes normal. No abnormal inguinal nodes palpated Neurologic: Grossly normal   Pelvic: External genitalia:  no lesions              Urethra:  normal appearing urethra with no masses, tenderness or lesions              Bartholins and Skenes: normal                 Vagina: normal appearing vagina with normal color and discharge, no lesions              Cervix: no lesions              Pap taken: No. Bimanual Exam:  Uterus:  normal size, contour, position, consistency, mobility, non-tender              Adnexa: normal adnexa and no mass, fullness, tenderness               Rectovaginal: Confirms               Anus:  normal sphincter tone, no lesions  Chaperone was present for exam.  A:  Well Woman  with normal exam PMP, no HRT H/O menorrhagia with u/s and endometrial biopsy 9/11 Strong family hx of breast cancer. Took Tamoxifen prophylacticly.  Family hx of cardiovascular disease Microscopic hematuria.  Neg micro 3/16.  Fatty liver   P: Mammogram yearly. D/W pt 3D screening due to family hx. pap smear with neg HR HPV testing 11/13. Pap done today. Labs with PCP Seeing Dr. Eden Emms now.  Will probably do this yearly. return annually or prn

## 2015-11-26 NOTE — Progress Notes (Signed)
Patient ID: Angel Burch, female   DOB: 26-Mar-1961, 55 y.o.   MRN: [id number]       Cardiology Office Note   Date:  11/27/2015   ID:  Angel Burch, DOB 15-May-1961, MRN [id number]  PCP:  Sid Falcon, MD  Cardiologist:   Charlton Haws, MD   Chief Complaint  Patient presents with  . Coronary Artery Disease    no sx      History of Present Illness: Angel Burch is a 55 y.o. female who presents for f/u  of CAD risk  Pt has a strong family hx of cardiovascular disease. Two of three brothers have had MI's. Mother with hx of MI. Reviewed her family tree graphic which is quite impressive especially on fathers side and two brothers. She has no symptoms or chest pain  ECG ok  Cholesterol only 101 not at level that would need Rx by guidelines.  Non smoker  Discussed screening tests and felt calcium score best test for her   Labs from primary reviewed normal BMET, CBC and TSH  TC 182  LDL 101  Now on statin last LDL 02/2015 down to 55   She had coronary calcium score April  2016  which I reviewed  Score 169 97th percentile for age and sex matched controls Also noted liver steatosis  She drinks ETOH rarely  Started on statin last visit with ASA  Some right knee pain that may be more arthritic than statin related  ETT done 11/18/14 and was normal    No cardiac complaints  Sees Dr Callie Fielding at [facility]  Some chronic left cervical neck pain   Most recent office notes and labs from primary reviewed   Past Medical History  Diagnosis Date  . Anemia   . Family history of breast cancer     took tamoxifen prophylactically  . Family history of heart disease     Past Surgical History  Procedure Laterality Date  . Endometrial ablation      hysteroscopy  . Tubal ligation    . Tonsillectomy and adenoidectomy       Current Outpatient Prescriptions  Medication Sig Dispense Refill  . aspirin 81 MG tablet Take 81 mg by mouth daily.    Marland Kitchen atorvastatin (LIPITOR) 10 MG tablet Take 1  tablet (10 mg total) by mouth daily. 90 tablet 3  . Calcium-Vitamin D-Vitamin K (CALCIUM SOFT CHEWS PO) Take 1 tablet by mouth daily.     . IRON PO Take 45 mg by mouth daily.     . Multiple Vitamin (MULTIVITAMIN) tablet Take 1 tablet by mouth daily.    Marland Kitchen PATADAY 0.2 % SOLN Place 1 drop into both eyes daily.   11   No current facility-administered medications for this visit.    Allergies:   Tape    Social History:  The patient  reports that she has never smoked. She has never used smokeless tobacco. She reports that she does not drink alcohol or use illicit drugs.   Family History:  The patient's family history includes Breast cancer (age of onset: 29) in her sister; Cirrhosis in her paternal grandfather; Dementia in her mother; Emphysema in her maternal grandfather; Heart attack in her brother, brother, father, and mother; Heart disease in her paternal grandmother; Pulmonary embolism in her paternal aunt.    ROS:  Please see the history of present illness.   Otherwise, review of systems are positive for none.   All other systems are reviewed and negative.  PHYSICAL EXAM: VS:  BP 122/76 mmHg  Pulse 68  Ht 5\' 2"  (1.575 m)  Wt 97.977 kg (216 lb)  BMI 39.50 kg/m2  SpO2 98%  LMP 07/05/2014 , BMI Body mass index is 39.5 kg/(m^2). GEN: Well nourished, well developed, in no acute distress HEENT: normal Neck: no JVD, carotid bruits, or masses Cardiac:  RRR; no murmurs, rubs, or gallops,no edema  Respiratory:  clear to auscultation bilaterally, normal work of breathing GI: soft, nontender, nondistended, + BS MS: no deformity or atrophy Skin: warm and dry, no rash Neuro:  Strength and sensation are intact Psych: euthymic mood, full affect   EKG:   SR rate 58  Low voltage  5/25 SR ate 69 low voltage normal    Recent Labs: 02/04/2015: ALT 20    Lipid Panel    Component Value Date/Time   CHOL 136 02/04/2015 0906   TRIG 92.0 02/04/2015 0906   HDL 62.90 02/04/2015 0906   CHOLHDL  2 02/04/2015 0906   VLDL 18.4 02/04/2015 0906   LDLCALC 55 02/04/2015 0906      Wt Readings from Last 3 Encounters:  11/27/15 97.977 kg (216 lb)  11/13/15 97.523 kg (215 lb)  02/07/15 98.34 kg (216 lb 12.8 oz)      Other studies Reviewed: Additional studies/ records that were reviewed today include: notes from OB/GYN. / [facility]     ASSESSMENT AND PLAN:  1.  CAD :  Bad family history with high CACS score   Tolerating statin / ASA  LDL very low now FU Calcium score 10/2017  2. Liver steatosis: not a drinker she is middle aged and overweight f/u with primary discussed with her  3. Poison ivy LE;s resolved  4. Right knee pain.  Normal exam doubt related to statin given asymmetry follow    Current medicines are reviewed at length with the patient today.  The patient does not have concerns regarding medicines.  The following changes have been made:  no change  Labs/ tests ordered today include:  Calcium Score  No orders of the defined types were placed in this encounter.     Disposition:   FU with  In a year     i    Signed, Charlton HawsPeter Toree Edling, MD  11/27/2015 9:00 AM    [facility] [address], [place], [place]  [address] Phone: [phone]; Fax: [phone]

## 2015-11-27 ENCOUNTER — Encounter: Payer: Self-pay | Admitting: Cardiovascular Disease

## 2015-11-27 ENCOUNTER — Ambulatory Visit (INDEPENDENT_AMBULATORY_CARE_PROVIDER_SITE_OTHER): Payer: 59 | Admitting: Cardiovascular Disease

## 2015-11-27 VITALS — BP 122/76 | HR 68 | Ht 62.0 in | Wt 216.0 lb

## 2015-11-27 DIAGNOSIS — I2510 Atherosclerotic heart disease of native coronary artery without angina pectoris: Secondary | ICD-10-CM

## 2015-11-27 DIAGNOSIS — I2583 Coronary atherosclerosis due to lipid rich plaque: Principal | ICD-10-CM

## 2015-11-27 MED ORDER — ATORVASTATIN CALCIUM 10 MG PO TABS: 10.0000 mg | ORAL_TABLET | Freq: Every day | ORAL | Status: DC

## 2015-11-27 NOTE — Patient Instructions (Addendum)

## 2016-03-16 ENCOUNTER — Other Ambulatory Visit: Payer: Self-pay | Admitting: Obstetrics & Gynecology

## 2016-03-16 DIAGNOSIS — Z1231 Encounter for screening mammogram for malignant neoplasm of breast: Secondary | ICD-10-CM

## 2016-03-25 ENCOUNTER — Ambulatory Visit
Admission: RE | Admit: 2016-03-25 | Discharge: 2016-03-25 | Disposition: A | Discharge status: Home / Self Care | Payer: 59 | Source: Ambulatory Visit | Attending: Obstetrics & Gynecology | Admitting: Obstetrics & Gynecology

## 2016-03-25 DIAGNOSIS — Z1231 Encounter for screening mammogram for malignant neoplasm of breast: Secondary | ICD-10-CM

## 2016-09-21 DIAGNOSIS — Z6841 Body Mass Index (BMI) 40.0 and over, adult: Secondary | ICD-10-CM

## 2016-09-24 DIAGNOSIS — I2510 Atherosclerotic heart disease of native coronary artery without angina pectoris: Secondary | ICD-10-CM | POA: Insufficient documentation

## 2016-09-24 DIAGNOSIS — K760 Fatty (change of) liver, not elsewhere classified: Secondary | ICD-10-CM | POA: Insufficient documentation

## 2016-11-04 ENCOUNTER — Other Ambulatory Visit: Payer: Self-pay | Admitting: Cardiovascular Disease

## 2016-12-11 NOTE — Progress Notes (Signed)
Patient ID: Angel Burch Nerio, female   DOB: 1961-01-06, 56 y.o.   MRN: [id number]       Cardiology Office Note   Date:  12/15/2016   ID:  Angel Burch Siravo, DOB 1961-01-06, MRN [id number]  PCP:  Loyal JacobsonKalish, Michael, MD  Cardiologist:   Charlton HawsPeter Dillan Candela, MD   Chief Complaint  Patient presents with  . Coronary Artery Disease      History of Present Illness: Angel Burch Ermis is a 56 y.o. female who presents for f/u  of CAD risk  Pt has a strong family hx of cardiovascular disease. Two of three brothers have had MI's. Mother with hx of MI. Reviewed her family tree graphic which is quite impressive especially on fathers side and two brothers. She has no symptoms or chest pain  ECG ok   Non smoker    Labs from primary reviewed normal BMET, CBC and TSH  TC 182  LDL 101  Now on statin last LDL 02/2015 down to 55   She had coronary calcium score April  2016  which I reviewed  Score 169 97th percentile for age and sex matched controls Also noted liver steatosis  She drinks ETOH rarely  Started on statin last visit with ASA  Some right knee pain that may be more arthritic than statin related  ETT done 11/18/14 and was normal    Some exertional dyspnea when she walks her lab. Especially going up hill Still working at [employer] for [employer] off [employer]  Most recent office notes and labs from primary reviewed LDL 66 normal LFTls  Past Medical History:  Diagnosis Date  . Anemia   . Family history of breast cancer    took tamoxifen prophylactically  . Family history of heart disease     Past Surgical History:  Procedure Laterality Date  . ENDOMETRIAL ABLATION     hysteroscopy  . TONSILLECTOMY AND ADENOIDECTOMY    . TUBAL LIGATION       Current Outpatient Prescriptions  Medication Sig Dispense Refill  . aspirin 81 MG tablet Take 81 mg by mouth daily.    Marland Kitchen. atorvastatin (LIPITOR) 10 MG tablet Take 1 tablet (10 mg total) by mouth daily. 90 tablet 3  . azelastine (OPTIVAR) 0.05 % ophthalmic  solution Place 1 drop into both eyes daily.    . Calcium-Vitamin D-Vitamin K (CALCIUM SOFT CHEWS PO) Take 1 tablet by mouth daily.     . IRON PO Take 65 mg by mouth daily.    . Multiple Vitamin (MULTIVITAMIN) tablet Take 1 tablet by mouth daily.     No current facility-administered medications for this visit.     Allergies:   Tape    Social History:  The patient  reports that she has never smoked. She has never used smokeless tobacco. She reports that she does not drink alcohol or use drugs.   Family History:  The patient's family history includes Breast cancer (age of onset: 939) in her sister; Cirrhosis in her paternal grandfather; Dementia in her mother; Emphysema in her maternal grandfather; Heart attack in her brother, brother, father, and mother; Heart disease in her paternal grandmother; Pulmonary embolism in her paternal aunt.    ROS:  Please see the history of present illness.   Otherwise, review of systems are positive for none.   All other systems are reviewed and negative.    PHYSICAL EXAM: VS:  BP 120/90   Pulse 66   Ht 5\' 2"  (1.575 m)   Wt 100.3 kg (  221 lb 3.2 oz)   SpO2 97%   BMI 40.46 kg/m  , BMI Body mass index is 40.46 kg/m. Affect appropriate Overweight white female HEENT: normal Neck supple with no adenopathy JVP normal no bruits no thyromegaly Lungs clear with no wheezing and good diaphragmatic motion Heart:  S1/S2 no murmur, no rub, gallop or click PMI normal Abdomen: benighn, BS positve, no tenderness, no AAA no bruit.  No HSM or HJR Distal pulses intact with no bruits No edema Neuro non-focal Skin warm and dry No muscular weakness    EKG:  11/27/15  SR rate 58  Low voltage  5/25 SR ate 69 low voltage normal 12/15/16 SR rate 64 normal low voltage    Recent Labs: No results found for requested labs within last 8760 hours.    Lipid Panel    Component Value Date/Time   CHOL 136 02/04/2015 0906   TRIG 92.0 02/04/2015 0906   HDL 62.90  02/04/2015 0906   CHOLHDL 2 02/04/2015 0906   VLDL 18.4 02/04/2015 0906   LDLCALC 55 02/04/2015 0906      Wt Readings from Last 3 Encounters:  12/15/16 100.3 kg (221 lb 3.2 oz)  11/27/15 98 kg (216 lb)  11/13/15 97.5 kg (215 lb)      Other studies Reviewed: Additional studies/ records that were reviewed today include: notes from OB/GYN. / [facility]     ASSESSMENT AND PLAN:  1.  CAD :  Bad family history with high CACS score   Tolerating statin / ASA  LDL very low now FU Calcium score 10/2017  2. Liver steatosis: not a drinker she is middle aged and overweight f/u with primary discussed with her  3. Right knee pain.  Normal exam doubt related to statin given asymmetry follow  4. Dyspnea:  Seems functional offered cardiopulmonary stress test and she declined for now will let us know If symptoms worse   Current medicines are reviewed at length with the patient today.  The patient does not have concerns regarding medicines.  The following changes have been made:  no change  Labs/ tests ordered today include:     Orders Placed This Encounter  Procedures  . EKG 12-Lead     Disposition:   FU with  In a year        Signed, Charlton Haws, MD  12/15/2016 8:23 AM    [facility] [address] [place], [place], [place]  [address] Phone: [phone]; Fax: [phone]

## 2016-12-15 ENCOUNTER — Ambulatory Visit (INDEPENDENT_AMBULATORY_CARE_PROVIDER_SITE_OTHER): Payer: 59 | Admitting: Cardiovascular Disease

## 2016-12-15 ENCOUNTER — Encounter: Payer: Self-pay | Admitting: Cardiovascular Disease

## 2016-12-15 VITALS — BP 120/90 | HR 66 | Ht 62.0 in | Wt 221.2 lb

## 2016-12-15 DIAGNOSIS — I2583 Coronary atherosclerosis due to lipid rich plaque: Secondary | ICD-10-CM | POA: Diagnosis not present

## 2016-12-15 DIAGNOSIS — I2510 Atherosclerotic heart disease of native coronary artery without angina pectoris: Secondary | ICD-10-CM | POA: Diagnosis not present

## 2016-12-15 MED ORDER — ATORVASTATIN CALCIUM 10 MG PO TABS: 10.0000 mg | ORAL_TABLET | Freq: Every day | ORAL | 3 refills | Status: DC

## 2016-12-15 NOTE — Patient Instructions (Signed)

## 2017-02-21 ENCOUNTER — Other Ambulatory Visit: Payer: Self-pay | Admitting: Obstetrics & Gynecology

## 2017-02-21 DIAGNOSIS — Z1231 Encounter for screening mammogram for malignant neoplasm of breast: Secondary | ICD-10-CM

## 2017-02-24 NOTE — Progress Notes (Signed)
56 y.o. G3P3 MarriedCaucasianF here for annual exam.  Doing well.  No vaginal bleeding.  Seeing Dr. Eden Emms yearly.    Husband has facial shingles.  She would like to consider the shingles vaccination.  PCP:  Dr. Leonette Most  Patient's last menstrual period was 07/05/2014.          Sexually active: Yes.    The current method of family planning is tubal ligation.    Exercising: Yes.    walking Smoker:  no  Health Maintenance: Pap:  09/05/14 negative, 05/18/12 negative, HR HPV negative   History of abnormal Pap:  no MMG:  03/25/16 BIRADS 1 negative  Colonoscopy:  01/11/14- repeat 3 years- polyps- Dr. Conley Rolls.  Has appt for follow-up in September. BMD:   none TDaP:  01/22/09  Pneumonia vaccine(s):  no Zostavax:   Reviewed today Hep C testing: donates blood  Screening Labs: pcp   reports that she has never smoked. She has never used smokeless tobacco. She reports that she does not drink alcohol or use drugs.  Past Medical History:  Diagnosis Date  . Anemia   . Family history of breast cancer    took tamoxifen prophylactically  . Family history of heart disease     Past Surgical History:  Procedure Laterality Date  . ENDOMETRIAL ABLATION     hysteroscopy  . TONSILLECTOMY AND ADENOIDECTOMY    . TUBAL LIGATION      Current Outpatient Prescriptions  Medication Sig Dispense Refill  . Ascorbic Acid (VITAMIN C PO) Take 500 mg by mouth daily.    Marland Kitchen aspirin 81 MG tablet Take 81 mg by mouth daily.    Marland Kitchen atorvastatin (LIPITOR) 10 MG tablet Take 1 tablet (10 mg total) by mouth daily. 90 tablet 3  . azelastine (OPTIVAR) 0.05 % ophthalmic solution Place 1 drop into both eyes daily.    . Calcium-Vitamin D-Vitamin K (CALCIUM SOFT CHEWS PO) Take 1 tablet by mouth daily.     . IRON PO Take 65 mg by mouth daily.    . Multiple Vitamin (MULTIVITAMIN) tablet Take 1 tablet by mouth daily.     No current facility-administered medications for this visit.     Family History  Problem Relation Age of Onset  .  Breast cancer Sister 104  . Heart attack Father   . Heart attack Mother   . Dementia Mother   . Heart attack Brother   . Pulmonary embolism Paternal Aunt   . Emphysema Maternal Grandfather   . Heart disease Paternal Grandmother   . Cirrhosis Paternal Grandfather   . Heart attack Brother     ROS:  Pertinent items are noted in HPI.  Otherwise, a comprehensive ROS was negative.  Exam:   BP 116/72   Pulse 70   Resp 16   Ht 5' 1.25" (1.556 m)   Wt 224 lb (101.6 kg)   LMP 07/05/2014   BMI 41.98 kg/m   Weight change: +9#   Height: 5' 1.25" (155.6 cm)  Ht Readings from Last 3 Encounters:  02/25/17 5' 1.25" (1.556 m)  12/15/16 5\' 2"  (1.575 m)  11/27/15 5\' 2"  (1.575 m)    General appearance: alert, cooperative and appears stated age Head: Normocephalic, without obvious abnormality, atraumatic Neck: no adenopathy, supple, symmetrical, trachea midline and thyroid normal to inspection and palpation Lungs: clear to auscultation bilaterally Breasts: normal appearance, no masses or tenderness Heart: regular rate and rhythm Abdomen: soft, non-tender; bowel sounds normal; no masses,  no organomegaly Extremities: extremities normal, atraumatic,  no cyanosis or edema Skin: Skin color, texture, turgor normal. No rashes or lesions Lymph nodes: Cervical, supraclavicular, and axillary nodes normal. No abnormal inguinal nodes palpated Neurologic: Grossly normal  Pelvic: External genitalia:  no lesions              Urethra:  normal appearing urethra with no masses, tenderness or lesions              Bartholins and Skenes: normal                 Vagina: normal appearing vagina with normal color and discharge, no lesions              Cervix: no lesions              Pap taken: Yes.   Bimanual Exam:  Uterus:  normal size, contour, position, consistency, mobility, non-tender              Adnexa: normal adnexa and no mass, fullness, tenderness               Rectovaginal: Confirms               Anus:   normal sphincter tone, no lesions  Chaperone was present for exam.  A:  Well Woman with normal exam PMP, no HRT Strong family hx of CVD.  Seeing Dr. Eden Emms.   Strong family hx of breast cancer.  Took Tamoxifen prophylactic. Fatty liver disease  P:   Mammogram yearly.  Doing 3D.  D/W pt yearly MRI.  Risks for breast cancer in her lifetime is 25.2%.  Copy is scanned into Media tab. Refer to genetic testing.  She's never had this done (but I thought she did due to taking Tamoxifen in the past) pap smear and HR HPV obtained  Labs with PCP Rx for shingles given Return annually or prn

## 2017-02-25 ENCOUNTER — Encounter: Payer: Self-pay | Admitting: Obstetrics & Gynecology

## 2017-02-25 ENCOUNTER — Other Ambulatory Visit (HOSPITAL_COMMUNITY)
Admission: RE | Admit: 2017-02-25 | Discharge: 2017-02-25 | Disposition: A | Discharge status: Home / Self Care | Payer: 59 | Source: Ambulatory Visit | Attending: Obstetrics & Gynecology | Admitting: Obstetrics & Gynecology

## 2017-02-25 ENCOUNTER — Ambulatory Visit (INDEPENDENT_AMBULATORY_CARE_PROVIDER_SITE_OTHER): Payer: 59 | Admitting: Obstetrics & Gynecology

## 2017-02-25 VITALS — BP 116/72 | HR 70 | Resp 16 | Ht 61.25 in | Wt 224.0 lb

## 2017-02-25 DIAGNOSIS — Z01419 Encounter for gynecological examination (general) (routine) without abnormal findings: Secondary | ICD-10-CM

## 2017-02-25 DIAGNOSIS — Z124 Encounter for screening for malignant neoplasm of cervix: Secondary | ICD-10-CM

## 2017-02-25 DIAGNOSIS — Z9189 Other specified personal risk factors, not elsewhere classified: Secondary | ICD-10-CM

## 2017-02-28 LAB — CYTOLOGY - PAP
Diagnosis: NEGATIVE
HPV: NOT DETECTED

## 2017-03-22 ENCOUNTER — Telehealth: Payer: Self-pay | Admitting: Genetic Counselor

## 2017-03-22 ENCOUNTER — Encounter: Payer: Self-pay | Admitting: Genetic Counselor

## 2017-03-22 NOTE — Telephone Encounter (Signed)
Genetic counseling appt has been scheduled for the pt to see Clydie Braun on 10/10 at 9am. Pt agreed to the appt date and time. Letter mailed.

## 2017-04-05 ENCOUNTER — Ambulatory Visit
Admission: RE | Admit: 2017-04-05 | Discharge: 2017-04-05 | Disposition: A | Discharge status: Home / Self Care | Payer: 59 | Source: Ambulatory Visit | Attending: Obstetrics & Gynecology | Admitting: Obstetrics & Gynecology

## 2017-04-05 DIAGNOSIS — Z1231 Encounter for screening mammogram for malignant neoplasm of breast: Secondary | ICD-10-CM

## 2017-04-13 ENCOUNTER — Encounter: Payer: 59 | Admitting: Genetic Counselor

## 2017-04-13 ENCOUNTER — Other Ambulatory Visit: Payer: 59

## 2017-04-27 ENCOUNTER — Ambulatory Visit (HOSPITAL_BASED_OUTPATIENT_CLINIC_OR_DEPARTMENT_OTHER): Payer: 59 | Admitting: Genetic Counselor

## 2017-04-27 ENCOUNTER — Other Ambulatory Visit: Payer: 59

## 2017-04-27 ENCOUNTER — Encounter: Payer: Self-pay | Admitting: Genetic Counselor

## 2017-04-27 DIAGNOSIS — Z803 Family history of malignant neoplasm of breast: Secondary | ICD-10-CM | POA: Diagnosis not present

## 2017-04-27 DIAGNOSIS — Z8041 Family history of malignant neoplasm of ovary: Secondary | ICD-10-CM

## 2017-04-27 DIAGNOSIS — Z800 Family history of malignant neoplasm of digestive organs: Secondary | ICD-10-CM | POA: Diagnosis not present

## 2017-04-27 DIAGNOSIS — Z315 Encounter for genetic counseling: Secondary | ICD-10-CM | POA: Diagnosis not present

## 2017-04-27 NOTE — Progress Notes (Signed)
REFERRING PROVIDER: Megan Salon, MD [address], [place] [address]  PRIMARY PROVIDER:  Jefm Petty, MD  PRIMARY REASON FOR VISIT:  1. Family history of breast cancer   2. Family history of ovarian cancer   3. Family history of colon cancer      HISTORY OF PRESENT ILLNESS:   [name], a 56 y.o. female, was seen for a [place] cancer genetics consultation at the request of Dr. Sabra Heck due to a family history of cancer.  [name] presents to clinic today to discuss the possibility of a hereditary predisposition to cancer, genetic testing, and to further clarify her future cancer risks, as well as potential cancer risks for family members. [name] is a 56 y.o. female with no personal history of cancer.    CANCER HISTORY:   No history exists.     HORMONAL RISK FACTORS:  Menarche was at age 19.  First live birth at age 4.  OCP use for approximately 8 years.  Ovaries intact: yes.  Hysterectomy: no.  Menopausal status: postmenopausal.  HRT use: 0 years. Colonoscopy: yes; total of 7 polyps. Mammogram within the last year: yes. Number of breast biopsies: 1. Up to date with pelvic exams:  yes. Any excessive radiation exposure in the past:  no  Past Medical History:  Diagnosis Date  . Anemia   . Family history of breast cancer    took tamoxifen prophylactically  . Family history of breast cancer   . Family history of colon cancer   . Family history of heart disease   . Family history of ovarian cancer     Past Surgical History:  Procedure Laterality Date  . ENDOMETRIAL ABLATION     hysteroscopy  . TONSILLECTOMY AND ADENOIDECTOMY    . TUBAL LIGATION      Social History   Social History  . Marital status: Married    Spouse name: N/A  . Number of children: N/A  . Years of education: N/A   Social History Main Topics  . Smoking status: Never Smoker  . Smokeless tobacco: Never Used  . Alcohol use No  . Drug use: No  . Sexual  activity: Yes    Partners: Male    Birth control/ protection: Surgical     Comment: BTL   Other Topics Concern  . Not on file   Social History Narrative  . No narrative on file     FAMILY HISTORY:  We obtained a detailed, 4-generation family history.  Significant diagnoses are listed below: Family History  Problem Relation Age of Onset  . Breast cancer Sister 77  . Heart attack Father 37  . Heart attack Mother   . Dementia Mother   . Heart attack Brother   . Pulmonary embolism Paternal Aunt 48  . Emphysema Maternal Grandfather   . Heart disease Paternal Grandmother   . Cirrhosis Paternal Grandfather   . Heart attack Brother   . Heart disease Paternal Uncle   . Colon cancer Paternal Aunt   . Colon cancer Paternal Aunt   . Breast cancer Cousin 30       paternal first cousin: recurred at 62 died at 46  . Ovarian cancer Cousin 49       paternal first cousin - died at 5  . Breast cancer Cousin 27       paternal first cousin; also had a carcinoid lung tumor at 66 and thyroid cancer at 68    The  patient has two boys and a girl who are cancer free.  She has three brothers and a sister.  Her sister developed breast cancer at 87 and died at 55.  Her brothers are cancer free.  Both parents are deceased.   The patient's mother had COPD and died at 15.  She had three brothers and four sisters, none who had cancer.  Both maternal grandparents are deceased of non cancer related issues.  The patient's father died of a heart attack at 12.  He had four brothers and four sisters.  One sister and one brother died in young childhood.  Two sisters died of colon cancer and one from a PE.  Three brothers died of heart diease.  One brother had a daughter with breast cancer at 35 and a daughter with ovarian cancer at 32.  The daughter with ovarian cancer also has a daughter with ovarian cancer.  Another brother had a daughter with breast cancer at 45, thyroid cancer at 28 and a carcinoid cancer at  2.  She had been seen at [facility] and discussion of Cowden syndrome had occurred.  Both paternal grandparents are deceased.  The grandmother had breast cancer in her 60's and the grandfather died of liver cirrhosis.  [name] is unaware of previous family history of genetic testing for hereditary cancer risks. Patient's maternal ancestors are of [place] descent, and paternal ancestors are of [place] descent. There is no reported Ashkenazi Jewish ancestry. There is no known consanguinity.  GENETIC COUNSELING ASSESSMENT: [name] is a 56 y.o. female with a family history of breast, ovarian and colon cancer which is somewhat suggestive of a hereditary cancer syndrome and predisposition to cancer. We, therefore, discussed and recommended the following at today's visit.   DISCUSSION: We discussed that about 5-10% of breast cancer is hereditary with most cases due to BRCA mutations.  PTEN mutations cause Cowden syndrome, so if her cousin was being worked up for this condition, we can test for this as well. We reviewed the possible risk for Lynch syndrome based on the colon, ovarian and breast cancer in the family.  We reviewed the characteristics, features and inheritance patterns of hereditary cancer syndromes. We also discussed genetic testing, including the appropriate family members to test, the process of testing, insurance coverage and turn-around-time for results. We discussed the implications of a negative, positive and/or variant of uncertain significant result. We recommended Ms. Hawks pursue genetic testing for the Common hereditary cancer gene panel. The Hereditary Gene Panel offered by [facility] includes sequencing and/or deletion duplication testing of the following 47 genes: APC, ATM, AXIN2, BARD1, BMPR1A, BRCA1, BRCA2, BRIP1, CDH1, CDK4, CDKN2A (p14ARF), CDKN2A (p16INK4a), CHEK2, CTNNA1, DICER1, EPCAM (Deletion/duplication testing only), GREM1 (promoter region deletion/duplication testing only),  KIT, MEN1, MLH1, MSH2, MSH3, MSH6, MUTYH, NBN, NF1, NHTL1, PALB2, PDGFRA, PMS2, POLD1, POLE, PTEN, RAD50, RAD51C, RAD51D, SDHB, SDHC, SDHD, SMAD4, SMARCA4. STK11, TP53, TSC1, TSC2, and VHL.  The following genes were evaluated for sequence changes only: SDHA and HOXB13 c.251G>A variant only.   Based on Ms. Patman's family history of cancer, she meets medical criteria for genetic testing. Despite that she meets criteria, she may still have an out of pocket cost. We discussed that if her out of pocket cost for testing is over $100, the laboratory will call and confirm whether she wants to proceed with testing.  If the out of pocket cost of testing is less than $100 she will be billed by the genetic testing laboratory.  In order to estimate her chance of having a BRCA mutation, we used statistical models (Tyrer Cusik and [place]) and laboratory data that take into account her personal medical history, family history and ancestry.  Because each model is different, there can be a lot of variability in the risks they give.  Therefore, these numbers must be considered a rough range and not a precise risk of having a BRCA mutation.  These models estimate that she has approximately a 13.71-22.03% chance of having a mutation. Based on this assessment of her family and personal history, genetic testing is recommended.  Based on the patient's personal and family history, statistical models (Tyrer Cusik and [place])  and literature data were used to estimate her risk of developing breast cancer. These estimate her lifetime risk of developing breast cancer to be approximately 11.97% to 16.4%. This estimation does not take into account any genetic testing results.  The patient's lifetime breast cancer risk is a preliminary estimate based on available information using one of several models endorsed by the [place] (ACS). The ACS recommends consideration of breast MRI screening as an adjunct to mammography for  patients at high risk (defined as 20% or greater lifetime risk). A more detailed breast cancer risk assessment can be considered, if clinically indicated.       PLAN: After considering the risks, benefits, and limitations, Ms. Arpino  provided informed consent to pursue genetic testing and the blood sample was sent to [facility] for analysis of the Common hereditary cancer panel. Results should be available within approximately 2-3 weeks' time, at which point they will be disclosed by telephone to Ms. Demorest, as will any additional recommendations warranted by these results. Ms. Beaird will receive a summary of her genetic counseling visit and a copy of her results once available. This information will also be available in Epic. We encouraged Ms. Lira to remain in contact with cancer genetics annually so that we can continuously update the family history and inform her of any changes in cancer genetics and testing that may be of benefit for her family. Ms. Savino questions were answered to her satisfaction today. Our contact information was provided should additional questions or concerns arise.  Based on Ms. Rubis's family history, we recommended her niece, the daughter of her sister who was diagnosed with breast cancer at age 76, have genetic counseling and testing. Ms. Nappier will let us know if we can be of any assistance in coordinating genetic counseling and/or testing for this family member.   Lastly, we encouraged Ms. Mcswain to remain in contact with cancer genetics annually so that we can continuously update the family history and inform her of any changes in cancer genetics and testing that may be of benefit for this family.   Ms.  Bouza questions were answered to her satisfaction today. Our contact information was provided should additional questions or concerns arise. Thank you for the referral and allowing [place] to share in the care of your patient.   Ryn Peine P. Florene Glen, [place],  [facility] Certified Genetic Counselor [email or web address] phone: [phone]  The patient was seen for a total of 60 minutes in face-to-face genetic counseling.  This patient was discussed with Drs. Magrinat, Lindi Adie and/or Burr Medico who agrees with the above.    _______________________________________________________________________ For Office Staff:  Number of people involved in session: 1 Was an Intern/ student involved with case: no

## 2017-05-10 ENCOUNTER — Ambulatory Visit: Payer: Self-pay | Admitting: Genetic Counselor

## 2017-05-10 ENCOUNTER — Encounter: Payer: Self-pay | Admitting: Genetic Counselor

## 2017-05-10 ENCOUNTER — Telehealth: Payer: Self-pay | Admitting: Genetic Counselor

## 2017-05-10 DIAGNOSIS — Z803 Family history of malignant neoplasm of breast: Secondary | ICD-10-CM

## 2017-05-10 DIAGNOSIS — Z1379 Encounter for other screening for genetic and chromosomal anomalies: Secondary | ICD-10-CM | POA: Insufficient documentation

## 2017-05-10 DIAGNOSIS — Z800 Family history of malignant neoplasm of digestive organs: Secondary | ICD-10-CM

## 2017-05-10 DIAGNOSIS — Z8041 Family history of malignant neoplasm of ovary: Secondary | ICD-10-CM

## 2017-05-10 NOTE — Progress Notes (Signed)
HPI: Ms. Pickerill was previously seen in the [place] clinic due to a family history of cancer and concerns regarding a hereditary predisposition to cancer. Please refer to our prior cancer genetics clinic note for more information regarding Ms. Mcgurk's medical, social and family histories, and our assessment and recommendations, at the time. Ms. Melikian recent genetic test results were disclosed to her, as were recommendations warranted by these results. These results and recommendations are discussed in more detail below.  CANCER HISTORY:   No history exists.    FAMILY HISTORY:  We obtained a detailed, 4-generation family history.  Significant diagnoses are listed below: Family History  Problem Relation Age of Onset  . Breast cancer Sister 17  . Heart attack Father 62  . Heart attack Mother   . Dementia Mother   . Heart attack Brother   . Pulmonary embolism Paternal Aunt 98  . Emphysema Maternal Grandfather   . Heart disease Paternal Grandmother   . Cirrhosis Paternal Grandfather   . Heart attack Brother   . Heart disease Paternal Uncle   . Colon cancer Paternal Aunt   . Colon cancer Paternal Aunt   . Breast cancer Cousin 30       paternal first cousin: recurred at 58 died at 50  . Ovarian cancer Cousin 55       paternal first cousin - died at 60  . Breast cancer Cousin 50       paternal first cousin; also had a carcinoid lung tumor at 22 and thyroid cancer at 67   The family history has been updated in regards to possible genetic testing in paternal cousins.  The patient has two boys and a girl who are cancer free.  She has three brothers and a sister.  Her sister developed breast cancer at 30 and died at 65.  Her brothers are cancer free.  Both parents are deceased.   The patient's mother had COPD and died at 46.  She had three brothers and four sisters, none who had cancer.  Both maternal grandparents are deceased of non cancer related issues.  The  patient's father died of a heart attack at 69.  He had four brothers and four sisters.  One sister and one brother died in young childhood.  Two sisters died of colon cancer and one from a PE.  Three brothers died of heart diease.  One brother had a daughter with breast cancer at 7 and a daughter with ovarian cancer at 51.  The daughter with ovarian cancer also has a daughter with ovarian cancer, who reportedly has had genetic testing and is positive for something.  Another brother had a daughter with breast cancer at 41, thyroid cancer at 13 and a carcinoid cancer at 42.  She had been seen at [facility] and discussion of Cowden syndrome had occurred.  Both paternal grandparents are deceased.  The grandmother had breast cancer in her 61's and the grandfather died of liver cirrhosis.  Ms. Krusemark is unaware of previous family history of genetic testing for hereditary cancer risks. Patient's maternal ancestors are of [place] descent, and paternal ancestors are of [place] descent. There is no reported Ashkenazi Jewish ancestry. There is no known consanguinity.  GENETIC TEST RESULTS: Genetic testing reported out on May 04, 2017 through the common hereditary cancer panel found no deleterious mutations.  The Hereditary Gene Panel offered by [facility] includes sequencing and/or deletion duplication testing of the following 47 genes: APC, ATM, AXIN2, BARD1,  BMPR1A, BRCA1, BRCA2, BRIP1, CDH1, CDK4, CDKN2A (p14ARF), CDKN2A (p16INK4a), CHEK2, CTNNA1, DICER1, EPCAM (Deletion/duplication testing only), GREM1 (promoter region deletion/duplication testing only), KIT, MEN1, MLH1, MSH2, MSH3, MSH6, MUTYH, NBN, NF1, NHTL1, PALB2, PDGFRA, PMS2, POLD1, POLE, PTEN, RAD50, RAD51C, RAD51D, SDHB, SDHC, SDHD, SMAD4, SMARCA4. STK11, TP53, TSC1, TSC2, and VHL.  The following genes were evaluated for sequence changes only: SDHA and HOXB13 c.251G>A variant only.  The test report has been scanned into EPIC and is located under the Molecular  Pathology section of the Results Review tab.   We discussed with Ms. Mikelson that since the current genetic testing is not perfect, it is possible there may be a gene mutation in one of these genes that current testing cannot detect, but that chance is small. We also discussed, that it is possible that another gene that has not yet been discovered, or that we have not yet tested, is responsible for the cancer diagnoses in the family, and it is, therefore, important to remain in touch with cancer genetics in the future so that we can continue to offer Ms. Lammert the most up to date genetic testing.     CANCER SCREENING RECOMMENDATIONS: Given Ms. Vetere's personal and family histories, we must interpret these negative results with some caution.  Families with features suggestive of hereditary risk for cancer tend to have multiple family members with cancer, diagnoses in multiple generations and diagnoses before the age of 55. Ms. Pallo's family exhibits some of these features. Thus this result may simply reflect our current inability to detect all mutations within these genes or there may be a different gene that has not yet been discovered or tested.  The patient reports that a cousin has undergone genetic testing and is positive for "something".  She will send me a copy of the report once she has it and we will reevaluate whether we need to do further testing, or if Ms. Badalamenti is a true negative.  True negative means that there is a pathogenic genetic change in the family and the patient does not have it.   Based on the Ms. Halter's personal and family history of cancer, as well as her genetic test results, statistical models (Tyrer Cusik)  and literature data were used to estimate her risk of developing breast cancer. These estimate her lifetime risk of developing breast cancer to be approximately 11.97% to 16.1%.  The patient's lifetime breast cancer risk is a preliminary estimate based on available  information using one of several models endorsed by the [place] (ACS). The ACS recommends consideration of breast MRI screening as an adjunct to mammography for patients at high risk (defined as 20% or greater lifetime risk). A more detailed breast cancer risk assessment can be considered, if clinically indicated.     RECOMMENDATIONS FOR FAMILY MEMBERS: Women in this family might be at some increased risk of developing cancer, over the general population risk, simply due to the family history of cancer. We recommended women in this family have a yearly mammogram beginning at age 36, or 39 years younger than the earliest onset of cancer, an annual clinical breast exam, and perform monthly breast self-exams. Women in this family should also have a gynecological exam as recommended by their primary provider. All family members should have a colonoscopy by age 48.  Based on Ms. Peasley's family history, we recommended her niece and nephew of her sister who had breast cancer at age 51, have genetic counseling and testing. Ms. Bordenave will  let us know if we can be of any assistance in coordinating genetic counseling and/or testing for this family member.   FOLLOW-UP: Lastly, we discussed with Ms. Quain that cancer genetics is a rapidly advancing field and it is possible that new genetic tests will be appropriate for her and/or her family members in the future. We encouraged her to remain in contact with cancer genetics on an annual basis so we can update her personal and family histories and let her know of advances in cancer genetics that may benefit this family.   Our contact number was provided. Ms. Schnabel's questions were answered to her satisfaction, and she knows she is welcome to call us at anytime with additional questions or concerns.   Roma Kayser, MS, [facility] Certified Genetic Counselor [email or web address]

## 2017-05-10 NOTE — Telephone Encounter (Signed)
LM on VM with good news.  Asked that she CB. 

## 2017-05-10 NOTE — Telephone Encounter (Signed)
Revealed negative genetic testing.  Discussed that we do not know why there is cancer in the family. It could be due to a different gene that we are not testing, or maybe our current technology may not be able to pick something up.  It will be important for her to keep in contact with genetics to keep up with whether additional testing may be needed.  Patient reports that her cousin's daughter had genetic testing and she thinks she is positive for something.  Patient will forward her cousins' report as soon as she has it.

## 2017-08-05 HISTORY — PX: MENISCUS REPAIR: SHX5179

## 2017-10-25 ENCOUNTER — Telehealth: Payer: Self-pay | Admitting: Cardiovascular Disease

## 2017-10-25 DIAGNOSIS — Z8249 Family history of ischemic heart disease and other diseases of the circulatory system: Secondary | ICD-10-CM

## 2017-10-25 DIAGNOSIS — I2583 Coronary atherosclerosis due to lipid rich plaque: Principal | ICD-10-CM

## 2017-10-25 DIAGNOSIS — I2510 Atherosclerotic heart disease of native coronary artery without angina pectoris: Secondary | ICD-10-CM

## 2017-10-25 NOTE — Telephone Encounter (Signed)
New message    Patient requesting order for  Calcium score testing

## 2017-10-25 NOTE — Telephone Encounter (Signed)
Attempted to return call to patient regarding scheduling Calcium Score. Left detailed message on patient's VM letting her know order will be placed and she will be contacted regarding scheduling. Instructed patient to call back with any questions.   From OV on 12/15/16 with PN: 1. CAD :  Bad family history with high CACS score   Tolerating statin / ASA  LDL very low now FU Calcium score 10/2017

## 2017-10-28 NOTE — Telephone Encounter (Signed)
Pt is scheduled for calcium score for 11/10/17 at 3:30 pm.  Pt made aware of appt date and time by CT scheduling.

## 2017-11-10 ENCOUNTER — Ambulatory Visit (INDEPENDENT_AMBULATORY_CARE_PROVIDER_SITE_OTHER)
Admission: RE | Admit: 2017-11-10 | Discharge: 2017-11-10 | Disposition: A | Discharge status: Home / Self Care | Payer: Self-pay | Source: Ambulatory Visit | Attending: Cardiovascular Disease | Admitting: Cardiovascular Disease

## 2017-11-10 DIAGNOSIS — I2583 Coronary atherosclerosis due to lipid rich plaque: Secondary | ICD-10-CM

## 2017-11-10 DIAGNOSIS — I2510 Atherosclerotic heart disease of native coronary artery without angina pectoris: Secondary | ICD-10-CM

## 2017-11-10 DIAGNOSIS — Z8249 Family history of ischemic heart disease and other diseases of the circulatory system: Secondary | ICD-10-CM

## 2017-11-14 ENCOUNTER — Telehealth: Payer: Self-pay

## 2017-11-14 DIAGNOSIS — I2510 Atherosclerotic heart disease of native coronary artery without angina pectoris: Secondary | ICD-10-CM

## 2017-11-14 DIAGNOSIS — R931 Abnormal findings on diagnostic imaging of heart and coronary circulation: Secondary | ICD-10-CM

## 2017-11-14 DIAGNOSIS — I2583 Coronary atherosclerosis due to lipid rich plaque: Secondary | ICD-10-CM

## 2017-11-14 NOTE — Telephone Encounter (Signed)
Patient aware of CT results. Per Dr. Eden Emms, Calcium score a lot higher than 2016 169-> 691.  Schedule f/u exercise myovue study continue asa and statin. Will send message to scheduling to make patient an appointment for test. Patient verbalized understanding and will send instructions for myoview through MyChart.

## 2017-11-14 NOTE — Telephone Encounter (Signed)
-----   Message from Wendall Stade, MD sent at 11/12/2017  1:30 PM EDT ----- Calcium score a lot higher than 2016 169-> 691.  Schedule f/u exercise myovue study continue asa and statin

## 2017-11-15 ENCOUNTER — Telehealth (HOSPITAL_COMMUNITY): Payer: Self-pay | Admitting: Cardiovascular Disease

## 2017-11-15 NOTE — Telephone Encounter (Signed)
User: Trina Ao A Date/time: 11/15/17 9:53 AM  Comment: Called pt and lmsg for her to Cb to get scheduled for a myoview..  Context:  Outcome: Left Message  Phone number: [phone] Phone Type: Mobile  Comm. type: Telephone Call type: Outgoing  Contact: Patrina Levering "Sandy" Relation to patient: Self

## 2017-11-30 ENCOUNTER — Telehealth (HOSPITAL_COMMUNITY): Payer: Self-pay | Admitting: *Deleted

## 2017-11-30 NOTE — Telephone Encounter (Signed)
Patient given detailed instructions per Myocardial Perfusion Study Information Sheet for the test on 12/05/17. Patient notified to arrive 15 minutes early and that it is imperative to arrive on time for appointment to keep from having the test rescheduled.  If you need to cancel or reschedule your appointment, please call the office within 24 hours of your appointment. . Patient verbalized understanding. Angel Burch    

## 2017-12-05 ENCOUNTER — Ambulatory Visit (HOSPITAL_COMMUNITY): Payer: 59 | Attending: Cardiology

## 2017-12-05 DIAGNOSIS — I2510 Atherosclerotic heart disease of native coronary artery without angina pectoris: Secondary | ICD-10-CM | POA: Insufficient documentation

## 2017-12-05 DIAGNOSIS — I2583 Coronary atherosclerosis due to lipid rich plaque: Secondary | ICD-10-CM | POA: Insufficient documentation

## 2017-12-05 DIAGNOSIS — R931 Abnormal findings on diagnostic imaging of heart and coronary circulation: Secondary | ICD-10-CM | POA: Insufficient documentation

## 2017-12-05 MED ORDER — TECHNETIUM TC 99M TETROFOSMIN IV KIT
31.3000 | PACK | Freq: Once | INTRAVENOUS | Status: AC | PRN
  Administered 2017-12-05: 31.3 via INTRAVENOUS
  Filled 2017-12-05: qty 32

## 2017-12-07 ENCOUNTER — Ambulatory Visit (HOSPITAL_COMMUNITY): Payer: 59 | Attending: Cardiovascular Disease

## 2017-12-07 LAB — MYOCARDIAL PERFUSION IMAGING
CHL CUP NUCLEAR SRS: 1
CHL CUP RESTING HR STRESS: 77 {beats}/min
CHL RATE OF PERCEIVED EXERTION: 18
CSEPED: 6 min
CSEPHR: 86 %
Estimated workload: 7 METS
LV dias vol: 90 mL (ref 46–106)
LVSYSVOL: 39 mL
MPHR: 163 {beats}/min
NUC STRESS TID: 0.89
Peak HR: 141 {beats}/min
RATE: 0.37
SDS: 3
SSS: 4

## 2017-12-07 MED ORDER — TECHNETIUM TC 99M TETROFOSMIN IV KIT
32.3000 | PACK | Freq: Once | INTRAVENOUS | Status: AC | PRN
  Administered 2017-12-07: 32.3 via INTRAVENOUS
  Filled 2017-12-07: qty 33

## 2017-12-21 ENCOUNTER — Other Ambulatory Visit: Payer: Self-pay | Admitting: Cardiovascular Disease

## 2018-01-09 ENCOUNTER — Encounter: Payer: Self-pay | Admitting: Cardiovascular Disease

## 2018-01-25 ENCOUNTER — Ambulatory Visit: Payer: 59 | Admitting: Cardiovascular Disease

## 2018-02-09 ENCOUNTER — Encounter: Payer: Self-pay | Admitting: Cardiovascular Disease

## 2018-02-09 ENCOUNTER — Other Ambulatory Visit: Payer: Self-pay | Admitting: *Deleted

## 2018-02-09 ENCOUNTER — Ambulatory Visit (INDEPENDENT_AMBULATORY_CARE_PROVIDER_SITE_OTHER): Payer: 59 | Admitting: Cardiovascular Disease

## 2018-02-09 VITALS — BP 126/80 | HR 82 | Ht 62.0 in | Wt 210.0 lb

## 2018-02-09 DIAGNOSIS — Z8249 Family history of ischemic heart disease and other diseases of the circulatory system: Secondary | ICD-10-CM | POA: Diagnosis not present

## 2018-02-09 DIAGNOSIS — I2510 Atherosclerotic heart disease of native coronary artery without angina pectoris: Secondary | ICD-10-CM | POA: Diagnosis not present

## 2018-02-09 DIAGNOSIS — I2583 Coronary atherosclerosis due to lipid rich plaque: Secondary | ICD-10-CM

## 2018-02-09 DIAGNOSIS — R931 Abnormal findings on diagnostic imaging of heart and coronary circulation: Secondary | ICD-10-CM | POA: Diagnosis not present

## 2018-02-09 MED ORDER — ATORVASTATIN CALCIUM 20 MG PO TABS: 20.0000 mg | ORAL_TABLET | Freq: Every day | ORAL | 3 refills | Status: DC

## 2018-02-09 NOTE — Patient Instructions (Addendum)
Medication Instructions:  Your physician has recommended you make the following change in your medication:  1-START Lipitor 20 mg by mouth daily.   Labwork: Your physician recommends that you return for lab work in: 3 months for Lipid and Liver panel.   Testing/Procedures: NONE  Follow-Up: Your physician wants you to follow-up in: 12 months with Dr. Eden EmmsNishan. You will receive a reminder letter in the mail two months in advance. If you don't receive a letter, please call our office to schedule the follow-up appointment.   If you need a refill on your cardiac medications before your next appointment, please call your pharmacy.

## 2018-02-09 NOTE — Progress Notes (Signed)
Patient ID: Angel LeveringSandra Melecio, female   DOB: 12-23-1960, 57 y.o.   MRN: [id number]       Cardiology Office Note   Date:  02/09/2018   ID:  Angel LeveringSandra Poinsett, DOB 12-23-1960, MRN [id number]  PCP:  Loyal JacobsonKalish, Michael, MD  Cardiologist:   Charlton HawsPeter Cosmo Tetreault, MD   No chief complaint on file.     History of Present Illness: Angel LeveringSandra Elena is a 57 y.o. female who presents for f/u  of CAD risk  Pt has a strong family hx of cardiovascular disease. Two of three brothers have had MI's. Mother with hx of MI. Reviewed her family tree graphic which is quite impressive especially on fathers side and two brothers. She has no symptoms or chest pain  ECG ok   Non smoker    On statin last LDL 02/2015 down to 55   She had coronary calcium score April  2016  which I reviewed  Score 169 97th percentile for age and sex matched controls Also noted liver steatosis  She drinks ETOH rarely F/U calcium score 11/10/17 higher 691 which is 99 th percentile   ETT done 11/18/14 and was normal   Myovue done 12/07/17 normal EF 56%   Still working at [employer] for [employer] off [employer] Doing 30 day diet and more active since she got her right knee surgery Discussed increasing lipitor as her LDL was 77   Past Medical History:  Diagnosis Date  . Anemia   . Family history of breast cancer    took tamoxifen prophylactically  . Family history of breast cancer   . Family history of colon cancer   . Family history of heart disease   . Family history of ovarian cancer     Past Surgical History:  Procedure Laterality Date  . ENDOMETRIAL ABLATION     hysteroscopy  . TONSILLECTOMY AND ADENOIDECTOMY    . TUBAL LIGATION       Current Outpatient Medications  Medication Sig Dispense Refill  . Ascorbic Acid (VITAMIN C PO) Take 500 mg by mouth daily.    Marland Kitchen. aspirin 81 MG tablet Take 81 mg by mouth daily.    Marland Kitchen. azelastine (OPTIVAR) 0.05 % ophthalmic solution Place 1 drop into both eyes daily.    . Calcium-Vitamin D-Vitamin K (CALCIUM  SOFT CHEWS PO) Take 1 tablet by mouth daily.     . IRON PO Take 65 mg by mouth daily.    . Multiple Vitamin (MULTIVITAMIN) tablet Take 1 tablet by mouth daily.    Marland Kitchen. atorvastatin (LIPITOR) 20 MG tablet Take 1 tablet (20 mg total) by mouth daily. 90 tablet 3   No current facility-administered medications for this visit.     Allergies:   Tape    Social History:  The patient  reports that she has never smoked. She has never used smokeless tobacco. She reports that she does not drink alcohol or use drugs.   Family History:  The patient's family history includes Breast cancer (age of onset: 7930) in her cousin; Breast cancer (age of onset: 7537) in her sister; Breast cancer (age of onset: 6040) in her cousin; Cirrhosis in her paternal grandfather; Colon cancer in her paternal aunt and paternal aunt; Dementia in her mother; Emphysema in her maternal grandfather; Heart attack in her brother, brother, and mother; Heart attack (age of onset: 8447) in her father; Heart disease in her paternal grandmother and paternal uncle; Ovarian cancer (age of onset: 2940) in her cousin; Pulmonary embolism (age of onset:  28) in her paternal aunt.    ROS:  Please see the history of present illness.   Otherwise, review of systems are positive for none.   All other systems are reviewed and negative.    PHYSICAL EXAM: VS:  BP 126/80   Pulse 82   Ht 5\' 2"  (1.575 m)   Wt 210 lb (95.3 kg)   LMP 07/06/2014   SpO2 99%   BMI 38.41 kg/m  , BMI Body mass index is 38.41 kg/m. Affect appropriate Overweight white female HEENT: normal Neck supple with no adenopathy JVP normal no bruits no thyromegaly Lungs clear with no wheezing and good diaphragmatic motion Heart:  S1/S2 no murmur, no rub, gallop or click PMI normal Abdomen: benighn, BS positve, no tenderness, no AAA no bruit.  No HSM or HJR Distal pulses intact with no bruits No edema Neuro non-focal Skin warm and dry No muscular weakness    EKG:  11/27/15  SR rate 58   Low voltage  5/25 SR ate 69 low voltage normal 12/15/16 SR rate 64 normal low voltage  02/09/18 SR rate 89 low voltage but normal   Recent Labs: No results found for requested labs within last 8760 hours.    Lipid Panel    Component Value Date/Time   CHOL 136 02/04/2015 0906   TRIG 92.0 02/04/2015 0906   HDL 62.90 02/04/2015 0906   CHOLHDL 2 02/04/2015 0906   VLDL 18.4 02/04/2015 0906   LDLCALC 55 02/04/2015 0906      Wt Readings from Last 3 Encounters:  02/09/18 210 lb (95.3 kg)  12/05/17 224 lb (101.6 kg)  02/25/17 224 lb (101.6 kg)      Other studies Reviewed: Additional studies/ records that were reviewed today include: notes from OB/GYN. / [facility]     ASSESSMENT AND PLAN:  1.  CAD :  Bad family history with high CACS score   Increase lipitor to 20 mg f/u labs in 3 months myovue negative  2. Liver steatosis: not a drinker she is middle aged and overweight f/u with primary discussed with her  3. Dyspnea:  Seems functional offered cardiopulmonary stress test and she declined for now will let us know If symptoms worse   Current medicines are reviewed at length with the patient today.  The patient does not have concerns regarding medicines.  The following changes have been made:  Lipitor 20 mg   Labs/ tests ordered today include:     Orders Placed This Encounter  Procedures  . Hepatic function panel  . Lipid panel  . EKG 12-Lead     Disposition:   FU with  In a year        Signed, Charlton Haws, MD  02/09/2018 3:50 PM    [facility] [address] [place], [place], [place]  [address] Phone: [phone]; Fax: [phone]

## 2018-02-09 NOTE — Telephone Encounter (Signed)
Patient called and stated that she just had an appointment here with Dr Eden EmmsNishan but she noticed on the avs that the atorvastatin rx was sent to the wrong pharmacy. She [facility]. She is aware that I will send this in for her as requested.

## 2018-03-03 ENCOUNTER — Other Ambulatory Visit: Payer: Self-pay | Admitting: Cardiovascular Disease

## 2018-03-07 NOTE — Telephone Encounter (Signed)
Outpatient Medication Detail    Disp Refills Start End   atorvastatin (LIPITOR) 20 MG tablet 90 tablet 3 02/09/2018    Sig - Route: Take 1 tablet (20 mg total) by mouth daily. - Oral   Sent to pharmacy as: atorvastatin (LIPITOR) 20 MG tablet   E-Prescribing Status: Receipt confirmed by pharmacy (02/09/2018 4:15 PM EDT)   Pharmacy   [facility] - [place],  - [address]

## 2018-04-28 ENCOUNTER — Encounter: Payer: Self-pay | Admitting: Obstetrics & Gynecology

## 2018-04-28 ENCOUNTER — Other Ambulatory Visit: Payer: Self-pay

## 2018-04-28 ENCOUNTER — Ambulatory Visit (INDEPENDENT_AMBULATORY_CARE_PROVIDER_SITE_OTHER): Payer: 59 | Admitting: Obstetrics & Gynecology

## 2018-04-28 ENCOUNTER — Encounter

## 2018-04-28 VITALS — BP 128/80 | HR 60 | Resp 14 | Ht 61.0 in | Wt 205.8 lb

## 2018-04-28 DIAGNOSIS — Z01419 Encounter for gynecological examination (general) (routine) without abnormal findings: Secondary | ICD-10-CM | POA: Diagnosis not present

## 2018-04-28 NOTE — Progress Notes (Signed)
57 y.o. G3P3 Married White or Caucasian female here for annual exam.  Retired at the end of September.  Went to [place], [place], for a retirement trip.  13 people all total went on the trip.    Denies vaginal bleeding.     Continues to see Dr. Eden Emms.  Had coronary calcium test and this was significantly changed.  This was 691.  Did a nuclear stress test that was normal.  Did genetic testing.  This was negative.    Has a grandson due in early November.    PCP:  Dr. Leonette Most.  Had physical earlier this year.  Had blood work then.    Patient's last menstrual period was 07/06/2014.          Sexually active: Yes.    The current method of family planning is tubal ligation.    Exercising: Yes.    walk daily  Smoker:  no  Health Maintenance: Pap:  02/25/17 Neg. HR HPV:neg   09/05/14 Neg  History of abnormal Pap:  no MMG:  04/05/17 BIRADS1:Neg.  Aware this is due.   Colonoscopy:  04/21/17 polyps. F/u 3 years. Dr. Conley Rolls   BMD:   never TDaP:  2010 Pneumonia vaccine(s):  n/a Shingrix:   Done  Hep C testing: done  Screening Labs: PCP   reports that she has never smoked. She has never used smokeless tobacco. She reports that she does not drink alcohol or use drugs.  Past Medical History:  Diagnosis Date  . Anemia   . Family history of breast cancer    took tamoxifen prophylactically  . Family history of breast cancer   . Family history of colon cancer   . Family history of heart disease   . Family history of ovarian cancer     Past Surgical History:  Procedure Laterality Date  . ENDOMETRIAL ABLATION     hysteroscopy  . MENISCUS REPAIR Right 08/2017  . TONSILLECTOMY AND ADENOIDECTOMY    . TUBAL LIGATION      Current Outpatient Medications  Medication Sig Dispense Refill  . Ascorbic Acid (VITAMIN C PO) Take 500 mg by mouth daily.    Marland Kitchen aspirin 81 MG tablet Take 81 mg by mouth daily.    Marland Kitchen atorvastatin (LIPITOR) 20 MG tablet Take 1 tablet (20 mg total) by mouth daily. 90 tablet 3  .  azelastine (OPTIVAR) 0.05 % ophthalmic solution Place 1 drop into both eyes daily.    . Calcium-Vitamin D-Vitamin K (CALCIUM SOFT CHEWS PO) Take 1 tablet by mouth daily.     . IRON PO Take 65 mg by mouth daily.    . Multiple Vitamin (MULTIVITAMIN) tablet Take 1 tablet by mouth daily.     No current facility-administered medications for this visit.     Family History  Problem Relation Age of Onset  . Breast cancer Sister 95  . Heart attack Father 51  . Heart attack Mother   . Dementia Mother   . Heart attack Brother   . Pulmonary embolism Paternal Aunt 28  . Emphysema Maternal Grandfather   . Heart disease Paternal Grandmother   . Cirrhosis Paternal Grandfather   . Heart attack Brother   . Heart disease Paternal Uncle   . Colon cancer Paternal Aunt   . Colon cancer Paternal Aunt   . Breast cancer Cousin 30       paternal first cousin: recurred at 75 died at 54  . Ovarian cancer Cousin 40  paternal first cousin - died at 28  . Breast cancer Cousin 58       paternal first cousin; also had a carcinoid lung tumor at 80 and thyroid cancer at 7    Review of Systems  All other systems reviewed and are negative.   Exam:   BP 128/80 (BP Location: Right Arm, Patient Position: Sitting, Cuff Size: Large)   Pulse 60   Resp 14   Ht 5\' 1"  (1.549 m)   Wt 205 lb 12.8 oz (93.4 kg)   LMP 07/06/2014   BMI 38.89 kg/m     Height: 5\' 1"  (154.9 cm)  Ht Readings from Last 3 Encounters:  04/28/18 5\' 1"  (1.549 m)  02/09/18 5\' 2"  (1.575 m)  12/05/17 5\' 2"  (1.575 m)    General appearance: alert, cooperative and appears stated age Head: Normocephalic, without obvious abnormality, atraumatic Neck: no adenopathy, supple, symmetrical, trachea midline and thyroid normal to inspection and palpation Lungs: clear to auscultation bilaterally Breasts: normal to inspection and palpation Heart: regular rate and rhythm Abdomen: soft, non-tender; bowel sounds normal; no masses,  no  organomegaly Extremities: extremities normal, atraumatic, no cyanosis or edema Skin: Skin color, texture, turgor normal. No rashes or lesions Lymph nodes: Cervical, supraclavicular, and axillary nodes normal. No abnormal inguinal nodes palpated Neurologic: Grossly normal   Pelvic: External genitalia:  no lesions              Urethra:  normal appearing urethra with no masses, tenderness or lesions              Bartholins and Skenes: normal                 Vagina: normal appearing vagina with normal color and discharge, no lesions              Cervix: no lesions              Pap taken: No. Bimanual Exam:  Uterus:  normal size, contour, position, consistency, mobility, non-tender              Adnexa: normal adnexa and no mass, fullness, tenderness               Rectovaginal: Confirms               Anus:  normal sphincter tone, no lesions  Chaperone was present for exam.  A:  Well Woman with normal exam PMP, no HRT Strong family hx of CVD and coronary calcium score Strong family hx of breast cancer.  Took Tamoxifen prophylactic.    P:   Mammogram yearly.  Doing 3D.  Yearly MRI discussed.  Genetic testing was negative.  Is going to schedule MMG and then will schedule breast MRI in six months pap smear with neg HR HPV was neg 2018 Lab work and vaccines are UTD return annually or prn

## 2018-05-08 ENCOUNTER — Other Ambulatory Visit: Payer: Self-pay | Admitting: Obstetrics & Gynecology

## 2018-05-08 ENCOUNTER — Ambulatory Visit
Admission: RE | Admit: 2018-05-08 | Discharge: 2018-05-08 | Disposition: A | Discharge status: Home / Self Care | Payer: 59 | Source: Ambulatory Visit

## 2018-05-08 DIAGNOSIS — Z1231 Encounter for screening mammogram for malignant neoplasm of breast: Secondary | ICD-10-CM

## 2018-05-12 ENCOUNTER — Other Ambulatory Visit: Payer: 59 | Admitting: *Deleted

## 2018-05-12 DIAGNOSIS — R931 Abnormal findings on diagnostic imaging of heart and coronary circulation: Secondary | ICD-10-CM

## 2018-05-12 DIAGNOSIS — I2583 Coronary atherosclerosis due to lipid rich plaque: Secondary | ICD-10-CM

## 2018-05-12 DIAGNOSIS — I2510 Atherosclerotic heart disease of native coronary artery without angina pectoris: Secondary | ICD-10-CM

## 2018-05-12 LAB — HEPATIC FUNCTION PANEL
ALT: 16 IU/L (ref 0–32)
AST: 14 IU/L (ref 0–40)
Albumin: 4.7 g/dL (ref 3.5–5.5)
Alkaline Phosphatase: 86 IU/L (ref 39–117)
Bilirubin Total: 0.6 mg/dL (ref 0.0–1.2)
Bilirubin, Direct: 0.15 mg/dL (ref 0.00–0.40)
Total Protein: 7.1 g/dL (ref 6.0–8.5)

## 2018-05-12 LAB — LIPID PANEL
Chol/HDL Ratio: 2.7 ratio (ref 0.0–4.4)
Cholesterol, Total: 133 mg/dL (ref 100–199)
HDL: 50 mg/dL (ref >39)
LDL Calculated: 56 mg/dL (ref 0–99)
TRIGLYCERIDES: 133 mg/dL (ref 0–149)
VLDL Cholesterol Cal: 27 mg/dL (ref 5–40)

## 2018-05-15 ENCOUNTER — Telehealth: Payer: Self-pay

## 2018-05-15 DIAGNOSIS — E785 Hyperlipidemia, unspecified: Secondary | ICD-10-CM

## 2018-05-15 DIAGNOSIS — Z79899 Other long term (current) drug therapy: Secondary | ICD-10-CM

## 2018-05-15 NOTE — Telephone Encounter (Signed)
Patient aware of lb result. Patient stated she would like to make a lab appointment when she has her yearly check up. Patient stated she would call and make her office visit and lab appointment at the same time. Order placed for lab work.

## 2018-05-15 NOTE — Telephone Encounter (Signed)
-----   Message from Wendall Stade, MD sent at 05/12/2018  5:26 PM EST ----- Cholesterol is at goal and LFT's are normal F/U labs in 6 months

## 2019-01-03 ENCOUNTER — Telehealth: Payer: Self-pay | Admitting: *Deleted

## 2019-01-03 DIAGNOSIS — Z9189 Other specified personal risk factors, not elsewhere classified: Secondary | ICD-10-CM

## 2019-01-03 DIAGNOSIS — Z8041 Family history of malignant neoplasm of ovary: Secondary | ICD-10-CM

## 2019-01-03 DIAGNOSIS — Z1231 Encounter for screening mammogram for malignant neoplasm of breast: Secondary | ICD-10-CM

## 2019-01-03 DIAGNOSIS — Z9229 Personal history of other drug therapy: Secondary | ICD-10-CM

## 2019-01-03 DIAGNOSIS — Z803 Family history of malignant neoplasm of breast: Secondary | ICD-10-CM

## 2019-01-03 DIAGNOSIS — Z1239 Encounter for other screening for malignant neoplasm of breast: Secondary | ICD-10-CM

## 2019-01-03 NOTE — Telephone Encounter (Signed)
-----   Message from Megan Salon, MD sent at 01/02/2019  4:35 PM EDT ----- Regarding: breast MRI Can you please call pt.  She is due breast MRI due to increased risk for breast cancer.  She took Tamoxifen for breast cancer prevention.  Does she want to proceed with scheduling?  I just want to touch base with her.  Thanks.  Vinnie Level

## 2019-01-03 NOTE — Telephone Encounter (Signed)
Spoke with patient. Advised as seen below per Dr. Sabra Heck. Patient request to proceed with breast MRI at [facility], order placed. Advised patient she will be contacted by [facility] directly to schedule, once scheduled our office will precert. Patient verbalizes understanding.   Routing to provider for final review. Patient is agreeable to disposition. Will close encounter.  Cc: Lerry Liner

## 2019-01-25 ENCOUNTER — Other Ambulatory Visit: Payer: Self-pay | Admitting: Cardiovascular Disease

## 2019-02-05 ENCOUNTER — Other Ambulatory Visit: Payer: Self-pay

## 2019-02-05 ENCOUNTER — Ambulatory Visit
Admission: RE | Admit: 2019-02-05 | Discharge: 2019-02-05 | Disposition: A | Discharge status: Home / Self Care | Payer: 59 | Source: Ambulatory Visit | Attending: Obstetrics & Gynecology | Admitting: Obstetrics & Gynecology

## 2019-02-05 DIAGNOSIS — Z9189 Other specified personal risk factors, not elsewhere classified: Secondary | ICD-10-CM

## 2019-02-05 DIAGNOSIS — Z803 Family history of malignant neoplasm of breast: Secondary | ICD-10-CM

## 2019-02-05 DIAGNOSIS — Z9229 Personal history of other drug therapy: Secondary | ICD-10-CM

## 2019-02-05 DIAGNOSIS — Z1239 Encounter for other screening for malignant neoplasm of breast: Secondary | ICD-10-CM

## 2019-02-05 DIAGNOSIS — Z8041 Family history of malignant neoplasm of ovary: Secondary | ICD-10-CM

## 2019-02-05 MED ORDER — GADOBUTROL 1 MMOL/ML IV SOLN
9.0000 mL | Freq: Once | INTRAVENOUS | Status: AC | PRN
  Administered 2019-02-05: 9 mL via INTRAVENOUS

## 2019-03-19 NOTE — Progress Notes (Signed)
Patient ID: Angel Burch, female   DOB: [birth year], 59 y.o.   MRN: [id number]       Cardiology Office Note   Date:  04/02/2019   ID:  Angel Burch, DOB [birth year], MRN [id number]  PCP:  Jefm Petty, MD  Cardiologist:   Jenkins Rouge, MD   No chief complaint on file.     History of Present Illness: Angel Burch is a 58 y.o. female who presents for f/u  of CAD risk  Pt has a strong family hx of cardiovascular disease. Two of three brothers have had MI's. Mother with hx of MI. Reviewed her family tree graphic which is quite impressive especially on fathers side and two brothers. She has no symptoms or chest pain  ECG ok   Non smoker    On statin last LDL 05/12/18 at goal 56 on lipitor   She had coronary calcium score April  2016  which I reviewed  Score 169 97th percentile for age and sex matched controls Also noted liver steatosis  She drinks ETOH rarely F/U calcium score 11/10/17 higher 691 which is 73 th percentile   ETT done 11/18/14 and was normal   Myovue done 12/07/17 normal EF 56%   Retired from post office  More active since she got her right knee surgery Compliant with statin  Has 2 grand kids in [place] 10 months and 3 years     Past Medical History:  Diagnosis Date  . Anemia   . Family history of breast cancer    took tamoxifen prophylactically  . Family history of breast cancer   . Family history of colon cancer   . Family history of heart disease   . Family history of ovarian cancer     Past Surgical History:  Procedure Laterality Date  . BREAST BIOPSY    . ENDOMETRIAL ABLATION     hysteroscopy  . MENISCUS REPAIR Right 08/2017  . TONSILLECTOMY AND ADENOIDECTOMY    . TUBAL LIGATION       Current Outpatient Medications  Medication Sig Dispense Refill  . Ascorbic Acid (VITAMIN C PO) Take 500 mg by mouth daily.    Marland Kitchen aspirin 81 MG tablet Take 81 mg by mouth daily.    Marland Kitchen atorvastatin (LIPITOR) 20 MG tablet TAKE 1 TABLET BY MOUTH  DAILY 90 tablet 3  .  azelastine (OPTIVAR) 0.05 % ophthalmic solution Place 1 drop into both eyes daily.    . Calcium-Vitamin D-Vitamin K (CALCIUM SOFT CHEWS PO) Take 1 tablet by mouth daily.     . IRON PO Take 65 mg by mouth daily.    . Multiple Vitamin (MULTIVITAMIN) tablet Take 1 tablet by mouth daily.     No current facility-administered medications for this visit.     Allergies:   Tape    Social History:  The patient  reports that she has never smoked. She has never used smokeless tobacco. She reports that she does not drink alcohol or use drugs.   Family History:  The patient's family history includes Breast cancer (age of onset: 22) in her cousin; Breast cancer (age of onset: 23) in her sister; Breast cancer (age of onset: 59) in her cousin; Cirrhosis in her paternal grandfather; Colon cancer in her paternal aunt and paternal aunt; Dementia in her mother; Emphysema in her maternal grandfather; Heart attack in her brother, brother, and mother; Heart attack (age of onset: 43) in her father; Heart disease in her paternal grandmother and paternal uncle; Ovarian cancer (age  of onset: 7740) in her cousin; Pulmonary embolism (age of onset: 3628) in her paternal aunt.    ROS:  Please see the history of present illness.   Otherwise, review of systems are positive for none.   All other systems are reviewed and negative.    PHYSICAL EXAM: VS:  BP 130/74   Pulse 65   Ht 5\' 2"  (1.575 m)   Wt 210 lb (95.3 kg)   LMP 07/06/2014   BMI 38.41 kg/m  , BMI Body mass index is 38.41 kg/m. Affect appropriate Overweight white female HEENT: normal Neck supple with no adenopathy JVP normal no bruits no thyromegaly Lungs clear with no wheezing and good diaphragmatic motion Heart:  S1/S2 no murmur, no rub, gallop or click PMI normal Abdomen: benighn, BS positve, no tenderness, no AAA no bruit.  No HSM or HJR Distal pulses intact with no bruits No edema Neuro non-focal Skin warm and dry No muscular weakness    EKG:   04/02/19 SR rate 65 low voltage normal no change since May 2017  Recent Labs: 03/26/2019: ALT 18    Lipid Panel    Component Value Date/Time   CHOL 128 03/26/2019 0959   TRIG 142 03/26/2019 0959   HDL 49 03/26/2019 0959   CHOLHDL 2.6 03/26/2019 0959   CHOLHDL 2 02/04/2015 0906   VLDL 18.4 02/04/2015 0906   LDLCALC 56 05/12/2018 0803      Wt Readings from Last 3 Encounters:  04/02/19 210 lb (95.3 kg)  04/28/18 205 lb 12.8 oz (93.4 kg)  02/09/18 210 lb (95.3 kg)      Other studies Reviewed: Additional studies/ records that were reviewed today include: notes from OB/GYN. / [facility]  Myovue 12/07/17 ETT 11/18/14    ASSESSMENT AND PLAN:  1.  CAD :  Bad family history with high CACS score   LDL at goal on current Lipitor dose normal ETT 2016 normal myovue 12/07/17  2. Liver steatosis: not a drinker she is middle aged and overweight f/u with primary discussed with her  3. Dyspnea:  Seems functional offered cardiopulmonary stress test and she declined for now will let us know If symptoms worse   Current medicines are reviewed at length with the patient today.  The patient does not have concerns regarding medicines.  The following changes have been made:  None   Labs/ tests ordered today include:     No orders of the defined types were placed in this encounter.    Disposition:   FU with  In a year        Signed, Charlton HawsPeter Iasiah Ozment, MD  04/02/2019 8:11 AM    [facility] [address], [place], [place]  [address] Phone: [phone]) [phone]; Fax: [phone]) [phone]

## 2019-03-26 ENCOUNTER — Other Ambulatory Visit: Payer: 59 | Admitting: *Deleted

## 2019-03-26 ENCOUNTER — Other Ambulatory Visit: Payer: Self-pay

## 2019-03-26 DIAGNOSIS — E785 Hyperlipidemia, unspecified: Secondary | ICD-10-CM

## 2019-03-26 DIAGNOSIS — Z79899 Other long term (current) drug therapy: Secondary | ICD-10-CM

## 2019-03-26 LAB — LIPID PANEL
Chol/HDL Ratio: 2.6 ratio (ref 0.0–4.4)
Cholesterol, Total: 128 mg/dL (ref 100–199)
HDL: 49 mg/dL (ref >39)
LDL Chol Calc (NIH): 55 mg/dL (ref 0–99)
Triglycerides: 142 mg/dL (ref 0–149)
VLDL Cholesterol Cal: 24 mg/dL (ref 5–40)

## 2019-03-26 LAB — HEPATIC FUNCTION PANEL
ALT: 18 IU/L (ref 0–32)
AST: 20 IU/L (ref 0–40)
Albumin: 4.3 g/dL (ref 3.8–4.9)
Alkaline Phosphatase: 87 IU/L (ref 39–117)
Bilirubin Total: 0.5 mg/dL (ref 0.0–1.2)
Bilirubin, Direct: 0.15 mg/dL (ref 0.00–0.40)
Total Protein: 6.9 g/dL (ref 6.0–8.5)

## 2019-04-02 ENCOUNTER — Other Ambulatory Visit: Payer: Self-pay

## 2019-04-02 ENCOUNTER — Encounter: Payer: Self-pay | Admitting: Cardiovascular Disease

## 2019-04-02 ENCOUNTER — Telehealth: Payer: Self-pay | Admitting: *Deleted

## 2019-04-02 ENCOUNTER — Ambulatory Visit (INDEPENDENT_AMBULATORY_CARE_PROVIDER_SITE_OTHER): Payer: 59 | Admitting: Cardiovascular Disease

## 2019-04-02 VITALS — BP 130/74 | HR 65 | Ht 62.0 in | Wt 210.0 lb

## 2019-04-02 DIAGNOSIS — I2510 Atherosclerotic heart disease of native coronary artery without angina pectoris: Secondary | ICD-10-CM | POA: Diagnosis not present

## 2019-04-02 DIAGNOSIS — E785 Hyperlipidemia, unspecified: Secondary | ICD-10-CM

## 2019-04-02 NOTE — Telephone Encounter (Signed)
Pt has been notified of lab results by phone with verbal understanding. Pt agreeable to repeat labs in 6 months, scheduled for 10/01/19. Pt thanked me for the call. Patient notified of result.  Please refer to phone note from today for complete details.   Julaine Hua, [facility] 04/02/2019 4:29 PM

## 2019-04-02 NOTE — Patient Instructions (Signed)
Medication Instructions:   If you need a refill on your cardiac medications before your next appointment, please call your pharmacy.   Lab work:  If you have labs (blood work) drawn today and your tests are completely normal, you will receive your results only by: Marland Kitchen MyChart Message (if you have MyChart) OR . A paper copy in the mail If you have any lab test that is abnormal or we need to change your treatment, we will call you to review the results.  Testing/Procedures: None ordered  Follow-Up: At [facility], you and your health needs are our priority.  As part of our continuing mission to provide you with exceptional heart care, we have created designated Provider Care Teams.  These Care Teams include your primary Cardiologist (physician) and Advanced Practice Providers (APPs -  Physician Assistants and Nurse Practitioners) who all work together to provide you with the care you need, when you need it. You will need a follow up appointment in 1 years.  Please call our office 2 months in advance to schedule this appointment.  You may see Dr. Johnsie Cancel or one of the following Advanced Practice Providers on your designated Care Team:   Truitt Merle, NP Cecilie Kicks, NP . Kathyrn Drown, NP

## 2019-04-02 NOTE — Telephone Encounter (Signed)
-----   Message from Peter C Nishan, MD sent at 04/01/2019  9:37 AM EDT ----- Cholesterol is at goal and LFT's are normal F/U labs in 6 months 

## 2019-05-01 ENCOUNTER — Other Ambulatory Visit: Payer: Self-pay

## 2019-05-03 ENCOUNTER — Ambulatory Visit (INDEPENDENT_AMBULATORY_CARE_PROVIDER_SITE_OTHER): Payer: 59 | Admitting: Obstetrics & Gynecology

## 2019-05-03 ENCOUNTER — Encounter: Payer: Self-pay | Admitting: Obstetrics & Gynecology

## 2019-05-03 ENCOUNTER — Other Ambulatory Visit: Payer: Self-pay

## 2019-05-03 VITALS — BP 122/70 | HR 84 | Temp 97.6°F | Resp 12 | Ht 61.0 in | Wt 208.0 lb

## 2019-05-03 DIAGNOSIS — Z01419 Encounter for gynecological examination (general) (routine) without abnormal findings: Secondary | ICD-10-CM

## 2019-05-03 DIAGNOSIS — Z9189 Other specified personal risk factors, not elsewhere classified: Secondary | ICD-10-CM | POA: Diagnosis not present

## 2019-05-03 DIAGNOSIS — Z23 Encounter for immunization: Secondary | ICD-10-CM | POA: Diagnosis not present

## 2019-05-03 NOTE — Progress Notes (Signed)
58 y.o. G3P3 Married White or Caucasian female here for annual exam.  Doing well.  Denies vaginal bleeding.  Her third brother has now had stents placed.  He was the healthiest of her three brothers.  Pt is now followed by Dr. Johnsie Cancel. Had repeat coronary calcium score that was increased.  She then had a nuclear stress test.    Did do breast MRI this summer.  This was normal.    Patient's last menstrual period was 07/06/2014.          Sexually active: Yes.    The current method of family planning is tubal ligation.    Exercising: Yes.    walking Smoker:  no  Health Maintenance: Pap:  02/25/17 Neg. HR HPV:neg              09/05/14 Neg  History of abnormal Pap:  no MMG:  05/08/18 BIRADS 1 negative/density b  02/05/19 MR BREAST BILATERAL Colonoscopy:  04/21/17 polyps. F/u 3 years BMD:   never TDaP:  2010 Pneumonia vaccine(s):  never Shingrix:   11/18/17, 7//17/19 Hep C testing: with PCP Screening Labs: PCP   reports that she has never smoked. She has never used smokeless tobacco. She reports that she does not drink alcohol or use drugs.  Past Medical History:  Diagnosis Date  . Anemia   . Family history of breast cancer    took tamoxifen prophylactically  . Family history of breast cancer   . Family history of colon cancer   . Family history of heart disease   . Family history of ovarian cancer     Past Surgical History:  Procedure Laterality Date  . BREAST BIOPSY    . ENDOMETRIAL ABLATION     hysteroscopy  . MENISCUS REPAIR Right 08/2017  . TONSILLECTOMY AND ADENOIDECTOMY    . TUBAL LIGATION      Current Outpatient Medications  Medication Sig Dispense Refill  . Ascorbic Acid (VITAMIN C PO) Take 500 mg by mouth daily.    Marland Kitchen aspirin 81 MG tablet Take 81 mg by mouth daily.    Marland Kitchen atorvastatin (LIPITOR) 20 MG tablet TAKE 1 TABLET BY MOUTH  DAILY 90 tablet 3  . azelastine (OPTIVAR) 0.05 % ophthalmic solution Place 1 drop into both eyes daily.    . Calcium-Vitamin D-Vitamin K  (CALCIUM SOFT CHEWS PO) Take 1 tablet by mouth daily.     . IRON PO Take 65 mg by mouth daily.    . Multiple Vitamin (MULTIVITAMIN) tablet Take 1 tablet by mouth daily.     No current facility-administered medications for this visit.     Family History  Problem Relation Age of Onset  . Breast cancer Sister 68  . Heart attack Father 78  . Heart attack Mother   . Dementia Mother   . Heart attack Brother   . Pulmonary embolism Paternal Aunt 67  . Emphysema Maternal Grandfather   . Heart disease Paternal Grandmother   . Cirrhosis Paternal Grandfather   . Heart attack Brother   . Heart disease Paternal Uncle   . Colon cancer Paternal Aunt   . Colon cancer Paternal Aunt   . Breast cancer Cousin 30       paternal first cousin: recurred at 74 died at 38  . Ovarian cancer Cousin 20       paternal first cousin - died at 45  . Breast cancer Cousin 23       paternal first cousin; also had a carcinoid lung  tumor at 46 and thyroid cancer at 45    Review of Systems  Constitutional: Negative.   HENT: Negative.   Eyes: Negative.   Respiratory: Negative.   Cardiovascular: Negative.   Gastrointestinal: Negative.   Endocrine: Negative.   Genitourinary: Negative.   Musculoskeletal: Negative.   Skin: Negative.   Allergic/Immunologic: Negative.   Neurological: Negative.   Hematological: Negative.   Psychiatric/Behavioral: Negative.     Exam:   BP 122/70 (BP Location: Right Arm, Patient Position: Sitting, Cuff Size: Normal)   Pulse 84   Temp 97.6 F (36.4 C) (Temporal)   Resp 12   Ht 5\' 1"  (1.549 m)   Wt 208 lb (94.3 kg)   LMP 07/06/2014   BMI 39.30 kg/m   Height: 5\' 1"  (154.9 cm)  Ht Readings from Last 3 Encounters:  05/03/19 5\' 1"  (1.549 m)  04/02/19 5\' 2"  (1.575 m)  04/28/18 5\' 1"  (1.549 m)    General appearance: alert, cooperative and appears stated age Head: Normocephalic, without obvious abnormality, atraumatic Neck: no adenopathy, supple, symmetrical, trachea midline  and thyroid normal to inspection and palpation Lungs: clear to auscultation bilaterally Breasts: normal appearance, no masses or tenderness Heart: regular rate and rhythm Abdomen: soft, non-tender; bowel sounds normal; no masses,  no organomegaly Extremities: extremities normal, atraumatic, no cyanosis or edema Skin: Skin color, texture, turgor normal. No rashes or lesions Lymph nodes: Cervical, supraclavicular, and axillary nodes normal. No abnormal inguinal nodes palpated Neurologic: Grossly normal   Pelvic: External genitalia:  no lesions              Urethra:  normal appearing urethra with no masses, tenderness or lesions              Bartholins and Skenes: normal                 Vagina: normal appearing vagina with normal color and discharge, no lesions              Cervix: no lesions              Pap taken: No. Bimanual Exam:  Uterus:  normal size, contour, position, consistency, mobility, non-tender              Adnexa: normal adnexa and no mass, fullness, tenderness               Rectovaginal: Confirms               Anus:  normal sphincter tone, no lesions  Chaperone was present for exam.  A:  Well Woman with normal exam PMP, no HRT Strong family hx of CVD and elevated coronary calcium score Strong family hx of breast cancer.  Took Tamoxifen prophylaxis.  Doing yearly MRI.   H/o colon polyps  P:   Mammogram guidelines reviewed.  Doing yearly MRI.  Reminder placed. pap smear with neg HR HPV 2018.  Not indicated today.  Newest guidelines reviewed. Lab work is UTD Tdap updated today and flu shot given today. Plan BMD between 59 and 64 Colonoscopy UTD.  Due 2021 return annually or prn

## 2019-05-12 IMAGING — NM NM MISC PROCEDURE
5 series · 30 of 30 positions shown · non-contrast
Comparison: none

[Series 1: wbr_s-proj_st stress - gated · 6.51mm/px · 6 of 512 frames shown]
[frame 43/512]
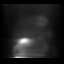
[frame 128/512]
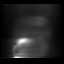
[frame 214/512]
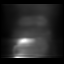
[frame 299/512]
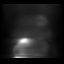
[frame 384/512]
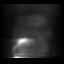
[frame 470/512]
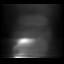

[Series 1: stress - gated · 6.51mm/px · 6 of 512 frames shown]
[frame 43/512]
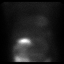
[frame 128/512]
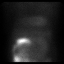
[frame 214/512]
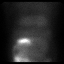
[frame 299/512]
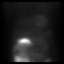
[frame 384/512]
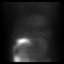
[frame 470/512]
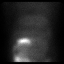

[Series 2: stress - perfusion · 6.51mm/px · 6 of 64 frames shown]
[frame 6/64]
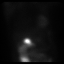
[frame 16/64]
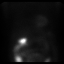
[frame 27/64]
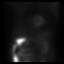
[frame 38/64]
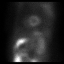
[frame 48/64]
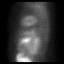
[frame 59/64]
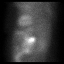

[Series 3: rest · 6.51mm/px · 6 of 64 frames shown]
[frame 6/64]
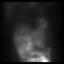
[frame 16/64]
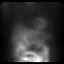
[frame 27/64]
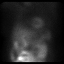
[frame 38/64]
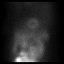
[frame 48/64]
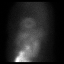
[frame 59/64]
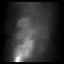

[Series 3: wbr_r-proj_st rest · 6.51mm/px · 6 of 64 frames shown]
[frame 6/64]
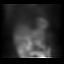
[frame 16/64]
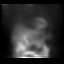
[frame 27/64]
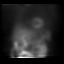
[frame 38/64]
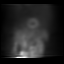
[frame 48/64]
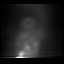
[frame 59/64]
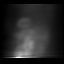

[30 of 30 positions shown; findings below may reference images not displayed]

Canned report from images found in remote index.

Refer to host system for actual result text.

## 2019-09-27 ENCOUNTER — Encounter: Payer: Self-pay | Admitting: Obstetrics & Gynecology

## 2019-09-28 ENCOUNTER — Telehealth: Payer: Self-pay | Admitting: Obstetrics & Gynecology

## 2019-09-28 NOTE — Telephone Encounter (Signed)
l to patient. Advised per MRI report from Aug 2020, was due for screening MMG Nov 2020.  Recommend proceed with screening mammogram appointment.   Routing to Dr Hyacinth Meeker. Encounter closed.

## 2019-09-28 NOTE — Telephone Encounter (Signed)
Patient sent the following correspondence through MyChart.  I had a Breast MRI on 02/05/2019.  Prior to that, I had a 3D Mammogram on 05/08/18.  I believe I am supposed to have an exam every 6 months.  Do I need to schedule another 3D Mammogram at this time?  Thanks, Angel Burch

## 2019-10-01 ENCOUNTER — Other Ambulatory Visit: Payer: Self-pay

## 2019-10-01 ENCOUNTER — Other Ambulatory Visit: Payer: 59 | Admitting: *Deleted

## 2019-10-01 DIAGNOSIS — E785 Hyperlipidemia, unspecified: Secondary | ICD-10-CM

## 2019-10-01 DIAGNOSIS — I2510 Atherosclerotic heart disease of native coronary artery without angina pectoris: Secondary | ICD-10-CM

## 2019-10-01 LAB — LIPID PANEL
Chol/HDL Ratio: 2.6 ratio (ref 0.0–4.4)
Cholesterol, Total: 132 mg/dL (ref 100–199)
HDL: 51 mg/dL (ref >39)
LDL Chol Calc (NIH): 60 mg/dL (ref 0–99)
Triglycerides: 119 mg/dL (ref 0–149)
VLDL Cholesterol Cal: 21 mg/dL (ref 5–40)

## 2019-10-01 LAB — HEPATIC FUNCTION PANEL
ALT: 17 IU/L (ref 0–32)
AST: 19 IU/L (ref 0–40)
Albumin: 4.2 g/dL (ref 3.8–4.9)
Alkaline Phosphatase: 93 IU/L (ref 39–117)
Bilirubin Total: 0.3 mg/dL (ref 0.0–1.2)
Bilirubin, Direct: 0.09 mg/dL (ref 0.00–0.40)
Total Protein: 6.8 g/dL (ref 6.0–8.5)

## 2019-10-02 ENCOUNTER — Other Ambulatory Visit: Payer: Self-pay

## 2019-10-02 DIAGNOSIS — E785 Hyperlipidemia, unspecified: Secondary | ICD-10-CM

## 2019-10-02 NOTE — Progress Notes (Signed)
Per lab work on 10/01/19. Will need repeat lab work in 6 months.

## 2019-10-03 ENCOUNTER — Other Ambulatory Visit: Payer: Self-pay | Admitting: Obstetrics & Gynecology

## 2019-10-03 DIAGNOSIS — Z1231 Encounter for screening mammogram for malignant neoplasm of breast: Secondary | ICD-10-CM

## 2019-10-04 ENCOUNTER — Ambulatory Visit
Admission: RE | Admit: 2019-10-04 | Discharge: 2019-10-04 | Disposition: A | Discharge status: Home / Self Care | Payer: 59 | Source: Ambulatory Visit

## 2019-10-04 ENCOUNTER — Other Ambulatory Visit: Payer: Self-pay

## 2019-10-04 DIAGNOSIS — Z1231 Encounter for screening mammogram for malignant neoplasm of breast: Secondary | ICD-10-CM

## 2019-12-15 ENCOUNTER — Other Ambulatory Visit: Payer: Self-pay | Admitting: Cardiovascular Disease

## 2020-03-12 ENCOUNTER — Telehealth: Payer: Self-pay | Admitting: *Deleted

## 2020-03-12 DIAGNOSIS — Z803 Family history of malignant neoplasm of breast: Secondary | ICD-10-CM

## 2020-03-12 DIAGNOSIS — Z9229 Personal history of other drug therapy: Secondary | ICD-10-CM

## 2020-03-12 DIAGNOSIS — Z9189 Other specified personal risk factors, not elsewhere classified: Secondary | ICD-10-CM

## 2020-03-12 DIAGNOSIS — Z8041 Family history of malignant neoplasm of ovary: Secondary | ICD-10-CM

## 2020-03-12 DIAGNOSIS — Z1239 Encounter for other screening for malignant neoplasm of breast: Secondary | ICD-10-CM

## 2020-03-12 NOTE — Telephone Encounter (Signed)
-----   Message from Jerene Bears, MD sent at 03/11/2020 12:54 PM EDT ----- Regarding: breast MRI Angel Burch, Could you reach out to this pt about her breast MRI and make sure she wants to proceed.  You've scheduled this for her in the past.  Lifetime risk is 16% but her sister was diagnosed at age 60 and she's already taken tamoxifen for risk reduction.    Thanks.  Rosalita Chessman

## 2020-03-12 NOTE — Telephone Encounter (Signed)
Spoke with patient, advised as seen below per Dr. Hyacinth Meeker.  Patient request to proceed with screening breast MRI, order placed, to be scheduled at [facility]. Patient is aware she will be contacted directly by [facility] to schedule, once scheduled our office will precert. Patient verbalizes understanding and is agreeable.   Routing to provider for final review. Patient is agreeable to disposition. Will close encounter.  Placed in IMG hold  Cc: Hayley Carder

## 2020-03-31 NOTE — Progress Notes (Signed)
Patient ID: Angel Burch, female   DOB: 09/24/1960, 59 y.o.   MRN: [id number]       Cardiology Office Note   Date:  04/09/2020   ID:  Angel Burch, DOB 1961/04/29, MRN [id number]  PCP:  Loyal Jacobson, MD  Cardiologist:   Charlton Haws, MD   No chief complaint on file.     History of Present Illness: Angel Burch is a 59 y.o. female who presents for f/u  of CAD risk  Pt has a strong family hx of cardiovascular disease. Two of three brothers have had MI's. Mother with hx of MI. Reviewed her family tree graphic which is quite impressive especially on fathers side and two brothers. She has no symptoms or chest pain  ECG ok   Non smoker    On statin last LDL 60 10/01/19 on lipitor   She had coronary calcium score April  2016  which I reviewed  Score 169 97th percentile for age and sex matched controls Also noted liver steatosis  She drinks ETOH rarely F/U calcium score 11/10/17 higher 691 which is 99 th percentile   ETT done 11/18/14 and was normal   Myovue done 12/07/17 normal EF 56%   Retired from [employer]  More active since she got her right knee surgery Compliant with statin  Has 2 grand kids in [place] 72 and 47 years old and 2 more in [place] 3rd child has Aspergers and just bought  A house She is excited to be moving to [place] in next 6 months Walks her [place] 2x/day with no chest pain   Has had vaccine for COVID but not flu shot     Past Medical History:  Diagnosis Date  . Anemia   . Family history of breast cancer    took tamoxifen prophylactically  . Family history of breast cancer   . Family history of colon cancer   . Family history of heart disease   . Family history of ovarian cancer     Past Surgical History:  Procedure Laterality Date  . BREAST BIOPSY    . ENDOMETRIAL ABLATION     hysteroscopy  . MENISCUS REPAIR Right 08/2017  . TONSILLECTOMY AND ADENOIDECTOMY    . TUBAL LIGATION       Current Outpatient Medications  Medication Sig Dispense  Refill  . Ascorbic Acid (VITAMIN C PO) Take 500 mg by mouth daily.    Marland Kitchen aspirin 81 MG tablet Take 81 mg by mouth daily.    Marland Kitchen atorvastatin (LIPITOR) 20 MG tablet TAKE 1 TABLET BY MOUTH  DAILY 90 tablet 3  . azelastine (OPTIVAR) 0.05 % ophthalmic solution Place 1 drop into both eyes daily.    . Calcium-Vitamin D-Vitamin K (CALCIUM SOFT CHEWS PO) Take 1 tablet by mouth daily.     . IRON PO Take 65 mg by mouth daily.    . Multiple Vitamin (MULTIVITAMIN) tablet Take 1 tablet by mouth daily.     No current facility-administered medications for this visit.    Allergies:   Tape    Social History:  The patient  reports that she has never smoked. She has never used smokeless tobacco. She reports that she does not drink alcohol and does not use drugs.   Family History:  The patient's family history includes Breast cancer (age of onset: 73) in her cousin; Breast cancer (age of onset: 27) in her sister; Breast cancer (age of onset: 21) in her cousin; Cirrhosis in her paternal grandfather; Colon cancer  in her paternal aunt and paternal aunt; Dementia in her mother; Emphysema in her maternal grandfather; Heart attack in her brother, brother, and mother; Heart attack (age of onset: 74) in her father; Heart disease in her paternal grandmother and paternal uncle; Ovarian cancer (age of onset: 73) in her cousin; Pulmonary embolism (age of onset: 40) in her paternal aunt.    ROS:  Please see the history of present illness.   Otherwise, review of systems are positive for none.   All other systems are reviewed and negative.    PHYSICAL EXAM: VS:  LMP 07/06/2014  , BMI There is no height or weight on file to calculate BMI. Affect appropriate Overweight white female HEENT: normal Neck supple with no adenopathy JVP normal no bruits no thyromegaly Lungs clear with no wheezing and good diaphragmatic motion Heart:  S1/S2 no murmur, no rub, gallop or click PMI normal Abdomen: benighn, BS positve, no tenderness,  no AAA no bruit.  No HSM or HJR Distal pulses intact with no bruits No edema Neuro non-focal Skin warm and dry No muscular weakness    EKG:  04/02/19 SR rate 65 low voltage normal no change since May 2017  Recent Labs: 10/01/2019: ALT 17    Lipid Panel    Component Value Date/Time   CHOL 132 10/01/2019 0747   TRIG 119 10/01/2019 0747   HDL 51 10/01/2019 0747   CHOLHDL 2.6 10/01/2019 0747   CHOLHDL 2 02/04/2015 0906   VLDL 18.4 02/04/2015 0906   LDLCALC 60 10/01/2019 0747      Wt Readings from Last 3 Encounters:  05/03/19 208 lb (94.3 kg)  04/02/19 210 lb (95.3 kg)  04/28/18 205 lb 12.8 oz (93.4 kg)      Other studies Reviewed: Additional studies/ records that were reviewed today include: notes from OB/GYN. / [facility]  Myovue 12/07/17 ETT 11/18/14    ASSESSMENT AND PLAN:  1.  CAD :  Bad family history with high CACS score   LDL at goal on current Lipitor dose normal ETT 2016 normal myovue 12/07/17  2. Liver steatosis: not a drinker she is middle aged and overweight f/u with primary discussed with her  3. Dyspnea:  Seems functional offered cardiopulmonary stress test and she declined for now will let us know If symptoms worse EF was normal 56% by myovue done 12/07/17   Current medicines are reviewed at length with the patient today.  The patient does not have concerns regarding medicines.  The following changes have been made:  None   Labs/ tests ordered today include:     No orders of the defined types were placed in this encounter.    Disposition:   FU with  In a year        Signed, Charlton Haws, MD  04/09/2020 9:46 AM    [facility] [address] [place], [place], [place]  [address] Phone: [phone]; Fax: [phone]

## 2020-04-02 ENCOUNTER — Ambulatory Visit
Admission: RE | Admit: 2020-04-02 | Discharge: 2020-04-02 | Disposition: A | Discharge status: Home / Self Care | Payer: 59 | Source: Ambulatory Visit | Attending: Obstetrics & Gynecology | Admitting: Obstetrics & Gynecology

## 2020-04-02 ENCOUNTER — Other Ambulatory Visit: Payer: Self-pay

## 2020-04-02 DIAGNOSIS — Z9189 Other specified personal risk factors, not elsewhere classified: Secondary | ICD-10-CM

## 2020-04-02 DIAGNOSIS — Z9229 Personal history of other drug therapy: Secondary | ICD-10-CM

## 2020-04-02 DIAGNOSIS — Z8041 Family history of malignant neoplasm of ovary: Secondary | ICD-10-CM

## 2020-04-02 DIAGNOSIS — Z803 Family history of malignant neoplasm of breast: Secondary | ICD-10-CM

## 2020-04-02 DIAGNOSIS — Z1239 Encounter for other screening for malignant neoplasm of breast: Secondary | ICD-10-CM

## 2020-04-02 MED ORDER — GADOBUTROL 1 MMOL/ML IV SOLN
8.0000 mL | Freq: Once | INTRAVENOUS | Status: AC | PRN
  Administered 2020-04-02: 8 mL via INTRAVENOUS

## 2020-04-03 ENCOUNTER — Other Ambulatory Visit: Payer: Self-pay | Admitting: Obstetrics & Gynecology

## 2020-04-03 DIAGNOSIS — Z803 Family history of malignant neoplasm of breast: Secondary | ICD-10-CM

## 2020-04-03 DIAGNOSIS — Z8041 Family history of malignant neoplasm of ovary: Secondary | ICD-10-CM

## 2020-04-03 DIAGNOSIS — Z1239 Encounter for other screening for malignant neoplasm of breast: Secondary | ICD-10-CM

## 2020-04-03 DIAGNOSIS — Z9229 Personal history of other drug therapy: Secondary | ICD-10-CM

## 2020-04-03 DIAGNOSIS — Z9189 Other specified personal risk factors, not elsewhere classified: Secondary | ICD-10-CM

## 2020-04-09 ENCOUNTER — Ambulatory Visit (INDEPENDENT_AMBULATORY_CARE_PROVIDER_SITE_OTHER): Payer: 59 | Admitting: Cardiovascular Disease

## 2020-04-09 ENCOUNTER — Encounter: Payer: Self-pay | Admitting: Cardiovascular Disease

## 2020-04-09 ENCOUNTER — Other Ambulatory Visit: Payer: Self-pay

## 2020-04-09 VITALS — BP 142/76 | HR 70 | Ht 61.0 in | Wt 212.0 lb

## 2020-04-09 DIAGNOSIS — I2510 Atherosclerotic heart disease of native coronary artery without angina pectoris: Secondary | ICD-10-CM | POA: Diagnosis not present

## 2020-04-09 MED ORDER — ATORVASTATIN CALCIUM 20 MG PO TABS: 20.0000 mg | ORAL_TABLET | Freq: Every day | ORAL | 3 refills | Status: DC

## 2020-04-09 NOTE — Patient Instructions (Addendum)

## 2020-04-11 ENCOUNTER — Ambulatory Visit
Admission: RE | Admit: 2020-04-11 | Discharge: 2020-04-11 | Disposition: A | Discharge status: Home / Self Care | Payer: 59 | Source: Ambulatory Visit | Attending: Obstetrics & Gynecology | Admitting: Obstetrics & Gynecology

## 2020-04-11 ENCOUNTER — Other Ambulatory Visit: Payer: Self-pay

## 2020-04-11 DIAGNOSIS — Z9229 Personal history of other drug therapy: Secondary | ICD-10-CM

## 2020-04-11 DIAGNOSIS — Z8041 Family history of malignant neoplasm of ovary: Secondary | ICD-10-CM

## 2020-04-11 DIAGNOSIS — Z1239 Encounter for other screening for malignant neoplasm of breast: Secondary | ICD-10-CM

## 2020-04-11 DIAGNOSIS — Z803 Family history of malignant neoplasm of breast: Secondary | ICD-10-CM

## 2020-04-11 DIAGNOSIS — Z9189 Other specified personal risk factors, not elsewhere classified: Secondary | ICD-10-CM

## 2020-04-11 MED ORDER — GADOBUTROL 1 MMOL/ML IV SOLN
10.0000 mL | Freq: Once | INTRAVENOUS | Status: AC | PRN
  Administered 2020-04-11: 10 mL via INTRAVENOUS

## 2020-11-17 ENCOUNTER — Other Ambulatory Visit: Payer: Self-pay | Admitting: Obstetrics & Gynecology

## 2020-11-17 DIAGNOSIS — Z1231 Encounter for screening mammogram for malignant neoplasm of breast: Secondary | ICD-10-CM

## 2020-12-05 ENCOUNTER — Encounter (HOSPITAL_BASED_OUTPATIENT_CLINIC_OR_DEPARTMENT_OTHER): Payer: Self-pay | Admitting: Obstetrics & Gynecology

## 2020-12-05 ENCOUNTER — Other Ambulatory Visit (HOSPITAL_COMMUNITY)
Admission: RE | Admit: 2020-12-05 | Discharge: 2020-12-05 | Disposition: A | Discharge status: Home / Self Care | Payer: 59 | Source: Ambulatory Visit | Attending: Obstetrics & Gynecology | Admitting: Obstetrics & Gynecology

## 2020-12-05 ENCOUNTER — Other Ambulatory Visit: Payer: Self-pay

## 2020-12-05 ENCOUNTER — Ambulatory Visit (INDEPENDENT_AMBULATORY_CARE_PROVIDER_SITE_OTHER): Payer: 59 | Admitting: Obstetrics & Gynecology

## 2020-12-05 VITALS — BP 134/72 | HR 63 | Ht 60.5 in | Wt 206.4 lb

## 2020-12-05 DIAGNOSIS — Z124 Encounter for screening for malignant neoplasm of cervix: Secondary | ICD-10-CM

## 2020-12-05 DIAGNOSIS — E2839 Other primary ovarian failure: Secondary | ICD-10-CM

## 2020-12-05 DIAGNOSIS — Z01419 Encounter for gynecological examination (general) (routine) without abnormal findings: Secondary | ICD-10-CM

## 2020-12-05 DIAGNOSIS — Z800 Family history of malignant neoplasm of digestive organs: Secondary | ICD-10-CM | POA: Diagnosis not present

## 2020-12-05 DIAGNOSIS — Z803 Family history of malignant neoplasm of breast: Secondary | ICD-10-CM | POA: Diagnosis not present

## 2020-12-05 DIAGNOSIS — I2510 Atherosclerotic heart disease of native coronary artery without angina pectoris: Secondary | ICD-10-CM

## 2020-12-05 NOTE — Progress Notes (Signed)
60 y.o. G3P3 Married Angel Burch or Caucasian female here for annual exam.  Building a house in [place], near [place].  Thinks house will be done in 8 months.  Sold home this year.  Living with son right now.  Hs taken 4 week trip to the [place].    Patient's last menstrual period was 07/06/2014.          Sexually active: Yes.    The current method of family planning is post menopausal status and BTL.    Exercising: Yes.    walking Smoker:  no  Health Maintenance: Pap:  02/25/2017 negative History of abnormal Pap:  no MMG:  10/04/2019 negative Colonoscopy:  04/21/17, follow up 5 years BMD:   ordered TDaP:  2020 Pneumonia vaccine(s):  Not indicated Shingrix:   2019 Hep C testing: 09/17/2016 Screening Labs: 12/02/20   reports that she has never smoked. She has never used smokeless tobacco. She reports that she does not drink alcohol and does not use drugs.  Past Medical History:  Diagnosis Date  . Anemia   . Family history of breast cancer    took tamoxifen prophylactically  . Family history of breast cancer   . Family history of colon cancer   . Family history of heart disease   . Family history of ovarian cancer     Past Surgical History:  Procedure Laterality Date  . BREAST BIOPSY    . ENDOMETRIAL ABLATION     hysteroscopy  . MENISCUS REPAIR Right 08/2017  . TONSILLECTOMY AND ADENOIDECTOMY    . TUBAL LIGATION      Current Outpatient Medications  Medication Sig Dispense Refill  . Ascorbic Acid (VITAMIN C PO) Take 500 mg by mouth daily.    Marland Kitchen aspirin 81 MG tablet Take 81 mg by mouth daily.    Marland Kitchen atorvastatin (LIPITOR) 20 MG tablet Take 1 tablet (20 mg total) by mouth daily. 90 tablet 3  . azelastine (OPTIVAR) 0.05 % ophthalmic solution Place 1 drop into both eyes daily.    . Calcium-Vitamin D-Vitamin K (CALCIUM SOFT CHEWS PO) Take 1 tablet by mouth daily.     . IRON PO Take 65 mg by mouth daily.    . Multiple Vitamin (MULTIVITAMIN) tablet Take 1 tablet by mouth daily.     No  current facility-administered medications for this visit.    Family History  Problem Relation Age of Onset  . Breast cancer Sister 62  . Heart attack Father 55  . Heart attack Mother   . Dementia Mother   . Heart attack Brother   . Pulmonary embolism Paternal Aunt 28  . Emphysema Maternal Grandfather   . Heart disease Paternal Grandmother   . Cirrhosis Paternal Grandfather   . Heart attack Brother   . Heart disease Paternal Uncle   . Colon cancer Paternal Aunt   . Colon cancer Paternal Aunt   . Breast cancer Cousin 30       paternal first cousin: recurred at 79 died at 85  . Ovarian cancer Cousin 20       paternal first cousin - died at 21  . Breast cancer Cousin 23       paternal first cousin; also had a carcinoid lung tumor at 51 and thyroid cancer at 2    Review of Systems  All other systems reviewed and are negative.   Exam:   BP 134/72 (BP Location: Right Arm, Patient Position: Sitting, Cuff Size: Large)   Pulse 63   Ht  5' 0.5" (1.537 m)   Wt 206 lb 6.4 oz (93.6 kg)   LMP 07/06/2014   BMI 39.65 kg/m   Height: 5' 0.5" (153.7 cm)  General appearance: alert, cooperative and appears stated age Head: Normocephalic, without obvious abnormality, atraumatic Neck: no adenopathy, supple, symmetrical, trachea midline and thyroid normal to inspection and palpation Lungs: clear to auscultation bilaterally Breasts: normal appearance, no masses or tenderness Heart: regular rate and rhythm Abdomen: soft, non-tender; bowel sounds normal; no masses,  no organomegaly Extremities: extremities normal, atraumatic, no cyanosis or edema Skin: Skin color, texture, turgor normal. No rashes or lesions Lymph nodes: Cervical, supraclavicular, and axillary nodes normal. No abnormal inguinal nodes palpated Neurologic: Grossly normal   Pelvic: External genitalia:  no lesions              Urethra:  normal appearing urethra with no masses, tenderness or lesions              Bartholins and  Skenes: normal                 Vagina: normal appearing vagina with normal color and no discharge, no lesions              Cervix: no lesions              Pap taken: Yes.   Bimanual Exam:  Uterus:  normal size, contour, position, consistency, mobility, non-tender              Adnexa: normal adnexa and no mass, fullness, tenderness               Rectovaginal: Confirms               Anus:  normal sphincter tone, no lesions  Chaperone, Ina Homes, CMA, was present for exam.  Assessment/Plan: 1. Well woman exam with routine gynecological exam - pap and HR HPV obtained today - MMG 10/04/2019 with breast MRI 04/2020 - BMD ordered placed - vaccines updated - colonoscopy 10/18, due next year - labs done 12/02/2020  2. Hypoestrogenism - DG BONE DENSITY (DXA); Future  3. Family history of breast cancer - pt has been on Tamoxifen for risk reduction.  Doing yearly breast MRI.    4. Family history of colon cancer  5. Coronary artery calcification seen on CAT scan - followed by Dr. Eden Emms.  On statin and baby ASA.

## 2020-12-09 ENCOUNTER — Other Ambulatory Visit: Payer: Self-pay

## 2020-12-09 ENCOUNTER — Ambulatory Visit
Admission: RE | Admit: 2020-12-09 | Discharge: 2020-12-09 | Disposition: A | Discharge status: Home / Self Care | Payer: 59 | Source: Ambulatory Visit

## 2020-12-09 DIAGNOSIS — Z1231 Encounter for screening mammogram for malignant neoplasm of breast: Secondary | ICD-10-CM

## 2020-12-09 LAB — CYTOLOGY - PAP
Comment: NEGATIVE
Diagnosis: NEGATIVE
High risk HPV: NEGATIVE

## 2020-12-10 ENCOUNTER — Ambulatory Visit (HOSPITAL_BASED_OUTPATIENT_CLINIC_OR_DEPARTMENT_OTHER)
Admission: RE | Admit: 2020-12-10 | Discharge: 2020-12-10 | Disposition: A | Discharge status: Home / Self Care | Payer: 59 | Source: Ambulatory Visit | Attending: Obstetrics & Gynecology | Admitting: Obstetrics & Gynecology

## 2020-12-10 DIAGNOSIS — E2839 Other primary ovarian failure: Secondary | ICD-10-CM | POA: Diagnosis present

## 2021-01-29 ENCOUNTER — Other Ambulatory Visit: Payer: Self-pay

## 2021-01-29 MED ORDER — ATORVASTATIN CALCIUM 20 MG PO TABS: 20.0000 mg | ORAL_TABLET | Freq: Every day | ORAL | 0 refills | Status: DC

## 2021-03-26 ENCOUNTER — Other Ambulatory Visit: Payer: Self-pay | Admitting: Cardiovascular Disease

## 2021-05-08 ENCOUNTER — Other Ambulatory Visit: Payer: Self-pay | Admitting: Cardiovascular Disease

## 2021-06-05 ENCOUNTER — Other Ambulatory Visit: Payer: Self-pay | Admitting: Cardiovascular Disease
